# Patient Record
Sex: Female | Born: 1975 | Race: White | Hispanic: No | Marital: Single | Smoking: Heavy tobacco smoker
Health system: Southern US, Community
[De-identification: ages and names within clinical notes are randomized; demographics above are authoritative.]

## PROBLEM LIST (undated history)

## (undated) DIAGNOSIS — S069XAA Unspecified intracranial injury with loss of consciousness status unknown, initial encounter: Secondary | ICD-10-CM

## (undated) DIAGNOSIS — E119 Type 2 diabetes mellitus without complications: Secondary | ICD-10-CM

## (undated) DIAGNOSIS — S069X9A Unspecified intracranial injury with loss of consciousness of unspecified duration, initial encounter: Secondary | ICD-10-CM

## (undated) DIAGNOSIS — K760 Fatty (change of) liver, not elsewhere classified: Secondary | ICD-10-CM

## (undated) DIAGNOSIS — E282 Polycystic ovarian syndrome: Secondary | ICD-10-CM

## (undated) HISTORY — PX: BREAST SURGERY: SHX581

## (undated) HISTORY — PX: CYST REMOVAL LEG: SHX6280

---

## 2015-09-09 ENCOUNTER — Emergency Department
Admission: EM | Admit: 2015-09-09 | Discharge: 2015-09-09 | Disposition: A | Discharge status: Home / Self Care | Payer: No Typology Code available for payment source | Attending: Emergency Medicine | Admitting: Emergency Medicine

## 2015-09-09 ENCOUNTER — Emergency Department: Payer: No Typology Code available for payment source

## 2015-09-09 DIAGNOSIS — Y9389 Activity, other specified: Secondary | ICD-10-CM | POA: Insufficient documentation

## 2015-09-09 DIAGNOSIS — S0990XA Unspecified injury of head, initial encounter: Secondary | ICD-10-CM | POA: Insufficient documentation

## 2015-09-09 DIAGNOSIS — Z3202 Encounter for pregnancy test, result negative: Secondary | ICD-10-CM | POA: Diagnosis not present

## 2015-09-09 DIAGNOSIS — E282 Polycystic ovarian syndrome: Secondary | ICD-10-CM | POA: Insufficient documentation

## 2015-09-09 DIAGNOSIS — Y92410 Unspecified street and highway as the place of occurrence of the external cause: Secondary | ICD-10-CM | POA: Insufficient documentation

## 2015-09-09 DIAGNOSIS — Y998 Other external cause status: Secondary | ICD-10-CM | POA: Diagnosis not present

## 2015-09-09 DIAGNOSIS — S199XXA Unspecified injury of neck, initial encounter: Secondary | ICD-10-CM | POA: Diagnosis present

## 2015-09-09 DIAGNOSIS — S161XXA Strain of muscle, fascia and tendon at neck level, initial encounter: Secondary | ICD-10-CM | POA: Diagnosis not present

## 2015-09-09 LAB — POCT PREGNANCY, URINE: Preg Test, Ur: NEGATIVE

## 2015-09-09 MED ORDER — IBUPROFEN 800 MG PO TABS
800.0000 mg | ORAL_TABLET | Freq: Three times a day (TID) | ORAL | Status: DC | PRN
Start: 2015-09-09 — End: 2016-06-27

## 2015-09-09 MED ORDER — METHOCARBAMOL 500 MG PO TABS
1000.0000 mg | ORAL_TABLET | Freq: Once | ORAL | Status: DC
  Filled 2015-09-09: qty 2

## 2015-09-09 MED ORDER — IBUPROFEN 800 MG PO TABS
800.0000 mg | ORAL_TABLET | Freq: Once | ORAL | Status: AC
  Administered 2015-09-09: 800 mg via ORAL
  Filled 2015-09-09: qty 1

## 2015-09-09 MED ORDER — METHOCARBAMOL 750 MG PO TABS
1500.0000 mg | ORAL_TABLET | Freq: Four times a day (QID) | ORAL | Status: DC
Start: 2015-09-09 — End: 2016-06-27

## 2015-09-09 MED ORDER — TRAMADOL HCL 50 MG PO TABS: 50.0000 mg | ORAL_TABLET | Freq: Four times a day (QID) | ORAL | Status: DC | PRN

## 2015-09-09 MED ORDER — TRAMADOL HCL 50 MG PO TABS
50.0000 mg | ORAL_TABLET | Freq: Once | ORAL | Status: DC
  Filled 2015-09-09: qty 1
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
## 2015-09-09 NOTE — ED Notes (Signed)
Applied c collar

## 2015-09-09 NOTE — Discharge Instructions (Signed)
Cervical Sprain A cervical sprain is when the tissues (ligaments) that hold the neck bones in place stretch or tear. HOME CARE   Put ice on the injured area.  Put ice in a plastic bag.  Place a towel between your skin and the bag.  Leave the ice on for 15-20 minutes, 3-4 times a day.  You may have been given a collar to wear. This collar keeps your neck from moving while you heal.  Do not take the collar off unless told by your doctor.  If you have long hair, keep it outside of the collar.  Ask your doctor before changing the position of your collar. You may need to change its position over time to make it more comfortable.  If you are allowed to take off the collar for cleaning or bathing, follow your doctor's instructions on how to do it safely.  Keep your collar clean by wiping it with mild soap and water. Dry it completely. If the collar has removable pads, remove them every 1-2 days to hand wash them with soap and water. Allow them to air dry. They should be dry before you wear them in the collar.  Do not drive while wearing the collar.  Only take medicine as told by your doctor.  Keep all doctor visits as told.  Keep all physical therapy visits as told.  Adjust your work station so that you have good posture while you work.  Avoid positions and activities that make your problems worse.  Warm up and stretch before being active. GET HELP IF:  Your pain is not controlled with medicine.  You cannot take less pain medicine over time as planned.  Your activity level does not improve as expected. GET HELP RIGHT AWAY IF:   You are bleeding.  Your stomach is upset.  You have an allergic reaction to your medicine.  You develop new problems that you cannot explain.  You lose feeling (become numb) or you cannot move any part of your body (paralysis).  You have tingling or weakness in any part of your body.  Your symptoms get worse. Symptoms include:  Pain,  soreness, stiffness, puffiness (swelling), or a burning feeling in your neck.  Pain when your neck is touched.  Shoulder or upper back pain.  Limited ability to move your neck.  Headache.  Dizziness.  Your hands or arms feel week, lose feeling, or tingle.  Muscle spasms.  Difficulty swallowing or chewing. MAKE SURE YOU:   Understand these instructions.  Will watch your condition.  Will get help right away if you are not doing well or get worse. Document Released: 06/03/2008 Document Revised: 08/18/2013 Document Reviewed: 06/23/2013 ExitCare Patient Information 2015 [employer]. This information is not intended to replace advice given to you by your health care provider. Make sure you discuss any questions you have with your health care provider.  
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
## 2015-11-07 ENCOUNTER — Ambulatory Visit: Payer: BLUE CROSS/BLUE SHIELD | Admitting: Physical Therapy

## 2015-11-09 ENCOUNTER — Encounter: Payer: BLUE CROSS/BLUE SHIELD | Admitting: Physical Therapy

## 2015-11-14 ENCOUNTER — Ambulatory Visit: Payer: BLUE CROSS/BLUE SHIELD | Attending: General Practice | Admitting: Physical Therapy

## 2015-11-14 ENCOUNTER — Encounter: Payer: Self-pay | Admitting: Physical Therapy

## 2015-11-14 ENCOUNTER — Encounter: Payer: BLUE CROSS/BLUE SHIELD | Admitting: Physical Therapy

## 2015-11-14 DIAGNOSIS — R29898 Other symptoms and signs involving the musculoskeletal system: Secondary | ICD-10-CM | POA: Insufficient documentation

## 2015-11-14 DIAGNOSIS — R42 Dizziness and giddiness: Secondary | ICD-10-CM | POA: Diagnosis present

## 2015-11-14 DIAGNOSIS — M542 Cervicalgia: Secondary | ICD-10-CM
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
## 2015-11-15 ENCOUNTER — Ambulatory Visit: Payer: BLUE CROSS/BLUE SHIELD

## 2015-11-15 DIAGNOSIS — R42 Dizziness and giddiness: Secondary | ICD-10-CM
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
## 2015-11-16 ENCOUNTER — Encounter: Payer: BLUE CROSS/BLUE SHIELD | Admitting: Physical Therapy

## 2015-11-20 ENCOUNTER — Encounter: Payer: Self-pay | Admitting: Physical Therapy

## 2015-11-20 ENCOUNTER — Ambulatory Visit: Payer: BLUE CROSS/BLUE SHIELD | Admitting: Physical Therapy

## 2015-11-20 DIAGNOSIS — M542 Cervicalgia: Secondary | ICD-10-CM

## 2015-11-20 DIAGNOSIS — R42 Dizziness and giddiness: Secondary | ICD-10-CM

## 2015-11-20 DIAGNOSIS — R29898 Other symptoms and signs involving the musculoskeletal system: Secondary | ICD-10-CM

## 2015-11-20 NOTE — Therapy (Signed)
[facility] [address] , [place], [address] Phone: [phone]   Fax:  [phone]  Physical Therapy Treatment  Patient Details  Name: Paula Cherry MRN: [id number] Date of Birth: 07/13/1976 Referring Provider: Mariam Dollarkearns amy  Encounter Date: 11/20/2015      PT End of Session - 11/20/15 1625    Visit Number 2   Number of Visits 25   Date for PT Re-Evaluation 02/06/16   PT Start Time 0410   PT Stop Time 0445   PT Time Calculation (min) 35 min   Activity Tolerance Patient limited by pain   Behavior During Therapy Anxious      History reviewed. No pertinent past medical history.  History reviewed. No pertinent past surgical history.  There were no vitals filed for this visit.  Visit Diagnosis:  Dizziness and giddiness  Neck pain  Decreased ROM of neck      Subjective Assessment - 11/20/15 1614    Subjective Patient is having 8/10 pain to her neck and her head, which is  constant. She says that her neck is has been huritng worse since the day of the evaluation which was 7/10.  She also has had her hair colored and was able to tolerate the required position to have it colored.    Currently in Pain? Yes   Pain Score 8    Pain Location Head   Pain Onset More than a month ago        Patient has reports of 8/10 at onset of appointment. She reports that her neck has been a lot worse since her evaluation from PT. She also reports that she used a virtual reality glasses that made her dizzy and has not tried to decrease her pain with heat or ice. She says that the heat makes her too hot. She did get her hair colored which would require head movements to get into position to rinse her hair.  She was seen today for reduction of pain symptoms using IFC e-stim at 7.4 intensity crossed pattern to neck x 20 mintues.  ROM neck rotation Left 50          Neck rotation right 70          Neck SBL40          Neck SBR 30    Neck flex 30          Neck extension 55 Patient response to treatment is 8 /10 pain to neck.                            PT Education - 11/20/15 1615    Education provided Yes   Education Details heat or ice   Person(s) Educated Patient   Methods Explanation   Comprehension Verbalized understanding             PT Long Term Goals - 11/14/15 1739    PT LONG TERM GOAL #1   Title Patient will increase BLE gross strength to 4+/5 as to improve functional strength for independent gait, increased standing tolerance and increased ADL ability.   Time 12   Period Weeks   Status New   PT LONG TERM GOAL #2   Title Patient will be able to perform household work/ chores without increase in symptoms   Time 12   Period Weeks   Status New   PT LONG TERM GOAL #3   Title Patient  will report a worst pain of 3/10 on VAS in             to improve tolerance with ADLs and reduced symptoms with activities.    Time 12   Period Weeks   Status New   PT LONG TERM GOAL #4   Title Patient will reduce NDI score to <20 as to demonstrate minimal disability with ADLs including improved sleeping tolerance,sitting tolerance etc for better mobility with ADLs   Time 12   Period Weeks   Status New               Plan - 11/20/15 1626    Clinical Impression Statement Patient continues to have high level of pain in neck and head. She used virtual glasses that require a lot of head movement at home and she reports made her dizzy. She has not tried the heat or ice that was recommended for pain reduction. She was treated with IFC crossed pattern to neck and gentle AROM movements.    Pt will benefit from skilled therapeutic intervention in order to improve on the following deficits Decreased mobility;Dizziness;Decreased range of motion;Decreased activity tolerance;Decreased strength;Impaired flexibility;Pain   Rehab Potential Good   PT Frequency 2x / week   PT Duration 12 weeks   PT  Treatment/Interventions Cryotherapy;Canalith Repostioning;Electrical Stimulation;Moist Heat;Traction;Ultrasound;Therapeutic activities;Therapeutic exercise;Manual techniques   PT Next Visit Plan Epley   PT Home Exercise Plan heating pad,   Consulted and Agree with Plan of Care Patient        Problem List There are no active problems to display for this patient.   Ezekiel Ina 11/20/2015, 4:28 PM  [place] [facility] MAIN [facility] SERVICES [address] [place], [place], [address] Phone: [phone]   Fax:  [phone]  Name: Jenan Buelow MRN: [id number] Date of Birth: 1976-07-01

## 2015-11-22 ENCOUNTER — Ambulatory Visit: Payer: BLUE CROSS/BLUE SHIELD

## 2015-11-22 DIAGNOSIS — M542 Cervicalgia: Secondary | ICD-10-CM

## 2015-11-22 DIAGNOSIS — R29898 Other symptoms and signs involving the musculoskeletal system: Secondary | ICD-10-CM

## 2015-11-22 NOTE — Therapy (Signed)
[place] [facility] MAIN [facility] [address] [place], [place], [address] Phone: [phone]   Fax:  [phone]  Physical Therapy Treatment  Patient Details  Name: Paula Cherry MRN: [id number] Date of Birth: 02/02/1976 Referring Provider: Mariam Dollarkearns amy  Encounter Date: 11/22/2015      PT End of Session - 11/22/15 1631    Visit Number 3   Number of Visits 25   Date for PT Re-Evaluation 02/06/16   PT Start Time 1610   PT Stop Time 1655   PT Time Calculation (min) 45 min   Activity Tolerance Patient limited by pain   Behavior During Therapy Anxious      No past medical history on file.  No past surgical history on file.  There were no vitals filed for this visit.  Visit Diagnosis:  Neck pain  Decreased ROM of neck      Subjective Assessment - 11/22/15 1626    Subjective Patient is having 7/10 pain to her R neck and her head, which is constant but worsens with head movement. Pt reports she is having a lot of L lateral neck pain (points near SCM area) with L rotation. Pt reports that when she turns her head to the L she feels like "my throat gets stuck on my neck."  Pt is performing HEP but states that head rotation increase her dizziness. [facility]. No specific questions on this date.    Currently in Pain? Yes   Pain Score 7    Pain Location Neck   Pain Orientation Right;Left  worse on R   Pain Type Chronic pain   Pain Onset More than a month ago      Treatment  E-stim  IFC e-stim starting at 6.8V and gradually increasing throughout time to 9.2V, applied MHP with therapy;   Manual Therapy Grade I central PA mobs to C2-T4 20 seconds/bout, 1 bout level for pain and spasm reduction. Pt reports pain with very gentle mobs of neck but with improving tolerance; Bilateral posterior cervical STM of paraspinals/extensors; Suboccipital release x 2 minutes; PROM of cervical spine into rotation  and lateral flexion with cues for patient to relax. Pt with considerable pain limiting L rotation; Review of HEP and emphasized importance                        PT Education - 11/22/15 1631    Education provided Yes   Education Details AROM HEP reinforced   Person(s) Educated Patient   Methods Explanation   Comprehension Verbalized understanding             PT Long Term Goals - 11/14/15 1739    PT LONG TERM GOAL #1   Title Patient will increase BLE gross strength to 4+/5 as to improve functional strength for independent gait, increased standing tolerance and increased ADL ability.   Time 12   Period Weeks   Status New   PT LONG TERM GOAL #2   Title Patient will be able to perform household work/ chores without increase in symptoms   Time 12   Period Weeks   Status New   PT LONG TERM GOAL #3   Title Patient will report a worst pain of 3/10 on VAS in             to improve tolerance with ADLs and reduced symptoms with activities.    Time 12  Period Weeks   Status New   PT LONG TERM GOAL #4   Title Patient will reduce NDI score to <20 as to demonstrate minimal disability with ADLs including improved sleeping tolerance,sitting tolerance etc for better mobility with ADLs   Time 12   Period Weeks   Status New               Plan - 11/22/15 1632    Clinical Impression Statement Pt continues to report high levels of neck and head pain which has not improved. She reports decreased sensation on posterior L neck during e-stim today. Pt reports decrease in neck pain to 6/10 following estim and manual therapy today. Discussed plan of care to continue reducing pain with goal of restoring neck AROM. Pt with decreased tolerance to AROM of cervical spine.    Pt will benefit from skilled therapeutic intervention in order to improve on the following deficits Decreased mobility;Dizziness;Decreased range of motion;Decreased activity tolerance;Decreased  strength;Impaired flexibility;Pain   Rehab Potential Good   PT Frequency 2x / week   PT Duration 12 weeks   PT Treatment/Interventions Cryotherapy;Canalith Repostioning;Electrical Stimulation;Moist Heat;Traction;Ultrasound;Therapeutic activities;Therapeutic exercise;Manual techniques   PT Next Visit Plan Continue modalities and manual therapy to decrease  pain in order to initiate more strengthening.    PT Home Exercise Plan heating pad, cervical AROM flexion and rotation   Consulted and Agree with Plan of Care Patient        Problem List There are no active problems to display for this patient.  Lynnea Maizes PT, DPT Huprich,Jason 11/22/2015, 5:28 PM  [facility] MAIN [facility] SERVICES [address] [place], [place], [address] Phone: [phone]   Fax:  [phone]  Name: Paula Cherry MRN: [id number] Date of Birth: [birth year]

## 2015-11-27 ENCOUNTER — Ambulatory Visit: Payer: BLUE CROSS/BLUE SHIELD | Admitting: Physical Therapy

## 2015-11-27 ENCOUNTER — Encounter: Payer: Self-pay | Admitting: Physical Therapy

## 2015-11-27 DIAGNOSIS — M542 Cervicalgia: Secondary | ICD-10-CM

## 2015-11-27 DIAGNOSIS — R29898 Other symptoms and signs involving the musculoskeletal system: Secondary | ICD-10-CM

## 2015-11-27 DIAGNOSIS — R42 Dizziness and giddiness: Secondary | ICD-10-CM

## 2015-11-27 NOTE — Therapy (Signed)
[place] [facility] MAIN [facility] SERVICES [address] [place], [place], [address] Phone: [phone]   Fax:  [phone]  Physical Therapy Treatment  Patient Details  Name: Paula Cherry MRN: [id number] Date of Birth: August 15, 1976 Referring Provider: Mariam Dollar amy  Encounter Date: 11/27/2015      PT End of Session - 11/27/15 1623    Visit Number 4   Number of Visits 25   Date for PT Re-Evaluation 02/06/16   PT Start Time 0410   PT Stop Time 0445   PT Time Calculation (min) 35 min      History reviewed. No pertinent past medical history.  History reviewed. No pertinent past surgical history.  There were no vitals filed for this visit.  Visit Diagnosis:  Neck pain  Decreased ROM of neck  Dizziness and giddiness      Subjective Assessment - 11/27/15 1616    Subjective Patient is having 7/10 pain that is worse on the sides of her neck. 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    Pain Score 7    Pain Location Neck   Pain Onset More than a month ago         Patient continues to have 7/10 pain reports using a little bit of heat at home and that hot shower feels the best. She has difficulty with neck extension and feels like her neck is locking up. She says that the inside of her throat is catching on the back of her neck. She says that the x-rays show that her neck is pushed the wrong way. She is not able to perform her AROM exercises due to getting dizzy; but then later says that she is doing them.  She was seen for e-stim  IFC crossed pattern to neck and MH to neck, followed by [place] to neck musculature bilaterally 1.5 cm squared x 15 minutes.  She does not tolerate AROM due to dizziness. Instructed in scapula retraction and chin tuck.  Reviewed HEP of AROM.  Patient reports that she has 7/10 pain following treatment.                         PT Education - 11/27/15 1623    Education provided Yes   Education Details HEP   Person(s)  Educated Patient   Methods Explanation   Comprehension Verbalized understanding             PT Long Term Goals - 11/14/15 1739    PT LONG TERM GOAL #1   Title Patient will increase BLE gross strength to 4+/5 as to improve functional strength for independent gait, increased standing tolerance and increased ADL ability.   Time 12   Period Weeks   Status New   PT LONG TERM GOAL #2   Title Patient will be able to perform household work/ chores without increase in symptoms   Time 12   Period Weeks   Status New   PT LONG TERM GOAL #3   Title Patient will report a worst pain of 3/10 on VAS in             to improve tolerance with ADLs and reduced symptoms with activities.    Time 12   Period Weeks   Status New   PT LONG TERM GOAL #4   Title Patient will reduce NDI score to <20 as to demonstrate minimal disability with ADLs including improved sleeping tolerance,sitting tolerance etc for better mobility with ADLs  Time 12   Period Weeks   Status New               Problem List There are no active problems to display for this patient.   Ezekiel InaMansfield, Jenniferann Stuckert S 11/27/2015, 5:02 PM  [facility] [address] [place], [place], [address] Phone: [phone]   Fax:  [phone]  Name: Paula Cherry MRN: [id number] Date of Birth: 08/14/1976

## 2015-11-29 ENCOUNTER — Ambulatory Visit: Payer: BLUE CROSS/BLUE SHIELD | Admitting: Physical Therapy

## 2015-11-30 ENCOUNTER — Ambulatory Visit: Payer: BLUE CROSS/BLUE SHIELD | Attending: General Practice | Admitting: Physical Therapy

## 2015-11-30 DIAGNOSIS — R29898 Other symptoms and signs involving the musculoskeletal system: Secondary | ICD-10-CM | POA: Insufficient documentation

## 2015-11-30 DIAGNOSIS — M542 Cervicalgia: Secondary | ICD-10-CM | POA: Insufficient documentation

## 2015-11-30 DIAGNOSIS — R42 Dizziness and giddiness: Secondary | ICD-10-CM | POA: Insufficient documentation

## 2015-12-04 ENCOUNTER — Ambulatory Visit: Payer: BLUE CROSS/BLUE SHIELD | Admitting: Physical Therapy

## 2015-12-04 DIAGNOSIS — M542 Cervicalgia: Secondary | ICD-10-CM | POA: Diagnosis present

## 2015-12-04 DIAGNOSIS — R29898 Other symptoms and signs involving the musculoskeletal system: Secondary | ICD-10-CM | POA: Diagnosis present

## 2015-12-04 DIAGNOSIS — R42 Dizziness and giddiness: Secondary | ICD-10-CM | POA: Diagnosis present
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
## 2015-12-06 ENCOUNTER — Ambulatory Visit: Payer: BLUE CROSS/BLUE SHIELD | Admitting: Physical Therapy

## 2015-12-06 ENCOUNTER — Encounter: Payer: Self-pay | Admitting: Physical Therapy

## 2015-12-06 DIAGNOSIS — M542 Cervicalgia: Secondary | ICD-10-CM

## 2015-12-06 DIAGNOSIS — R29898 Other symptoms and signs involving the musculoskeletal system: Secondary | ICD-10-CM

## 2015-12-06 DIAGNOSIS — R42 Dizziness and giddiness: Secondary | ICD-10-CM

## 2015-12-06 NOTE — Therapy (Signed)
[place] [facility] MAIN [facility] SERVICES [address] [place], [place], [address] Phone: [phone]   Fax:  [phone]  Physical Therapy Treatment  Patient Details  Name: Paula Cherry MRN: [id number] Date of Birth: 08/05/1976 Referring Provider: Mariam Dollar amy  Encounter Date: 12/06/2015      PT End of Session - 12/06/15 1617    Visit Number 6   Number of Visits 25   Date for PT Re-Evaluation 02/06/16   PT Start Time 0400   PT Stop Time 0445   PT Time Calculation (min) 45 min   Activity Tolerance Patient tolerated treatment well;Patient limited by pain   Behavior During Therapy Anxious;Agitated      History reviewed. No pertinent past medical history.  History reviewed. No pertinent past surgical history.  There were no vitals filed for this visit.  Visit Diagnosis:  Neck pain  Decreased ROM of neck  Dizziness and giddiness      Subjective Assessment - 12/06/15 1612    Subjective Patient reports that she continues to have dizziness, severe pain in neck 9/10. She worked today and feel that her pain is increased from  working.  She reports that she is having increased BP and HR and she has family medical history of heart disease. She is seening and  ENT Jan 9th, 2017 for the ringing in her earls L>R    Limitations Sitting   Currently in Pain? Yes   Pain Score 9    Pain Onset More than a month ago                                 PT Education - 12/06/15 1616    Education Details Reviewed ROM exercises and using heat for pain.    Person(s) Educated Patient   Methods Explanation   Comprehension Verbalized understanding             PT Long Term Goals - 11/14/15 1739    PT LONG TERM GOAL #1   Title Patient will increase BLE gross strength to 4+/5 as to improve functional strength for independent gait, increased standing tolerance and increased ADL ability.   Time 12   Period Weeks   Status New   PT LONG TERM  GOAL #2   Title Patient will be able to perform household work/ chores without increase in symptoms   Time 12   Period Weeks   Status New   PT LONG TERM GOAL #3   Title Patient will report a worst pain of 3/10 on VAS in             to improve tolerance with ADLs and reduced symptoms with activities.    Time 12   Period Weeks   Status New   PT LONG TERM GOAL #4   Title Patient will reduce NDI score to <20 as to demonstrate minimal disability with ADLs including improved sleeping tolerance,sitting tolerance etc for better mobility with ADLs   Time 12   Period Weeks   Status New               Plan - 12/06/15 1617    Clinical Impression Statement Patient continues to have increased pain and difficulty with neck movements due to pain and dizziness. ROM  right rotation 70 deg, left rotation 50 deg  left SB l40 SB right 40 flex  40 , ext 55 deg. All movements are painful.  Pt will benefit from skilled therapeutic intervention in order to improve on the following deficits Decreased mobility;Dizziness;Decreased range of motion;Decreased activity tolerance;Decreased strength;Impaired flexibility;Pain   Rehab Potential Good   PT Frequency 2x / week   PT Treatment/Interventions Cryotherapy;Canalith Repostioning;Electrical Stimulation;Moist Heat;Traction;Ultrasound;Therapeutic activities;Therapeutic exercise;Manual techniques   PT Next Visit Plan Progress postural strengthening and soft tissue mobilization as indicated and appropriate.    PT Home Exercise Plan Low rows and mid rows with yellow t-band    Consulted and Agree with Plan of Care Patient        Problem List There are no active problems to display for this patient.   Ezekiel InaMansfield, Layan Zalenski S 12/06/2015, 4:49 PM  [facility] MAIN [facility] [address] [place], [place], [address] Phone: [phone]   Fax:  [phone]  Name: Ashlley Detert MRN: [id number] Date of Birth:  02/25/1976

## 2015-12-06 NOTE — Therapy (Addendum)
[place] [facility] MAIN [facility] SERVICES [address] [place], [place], [address] Phone: [phone]   Fax:  [phone]  Physical Therapy Treatment  Patient Details  Name: Paula Cherry MRN: [id number] Date of Birth: [birth year] Referring Provider: Mariam Dollar amy  Encounter Date: 12/06/2015      PT End of Session - 12/06/15 1617    Visit Number 6   Number of Visits 25   Date for PT Re-Evaluation 02/06/16   PT Start Time 0400   PT Stop Time 0445   PT Time Calculation (min) 45 min   Activity Tolerance Patient tolerated treatment well;Patient limited by pain   Behavior During Therapy Anxious;Agitated      History reviewed. No pertinent past medical history.  History reviewed. No pertinent past surgical history.  There were no vitals filed for this visit.  Visit Diagnosis:  Neck pain  Decreased ROM of neck  Dizziness and giddiness      Subjective Assessment - 12/06/15 1612    Subjective Patient reports that she continues to have dizziness, severe pain in neck 9/10. She worked today and feel that her pain is increased from  working.  She reports that she is having increased BP and HR and she has family medical history of heart disease. She is seeing and  ENT Jan 9th, 2017 for the ringing in her earls L>R    Limitations Sitting   Currently in Pain? Yes   Pain Score 9    Pain Onset More than a month ago      E-stim with crossed pattern to neck and upper thoracic spine x 20 minutes with MH to neck.  ROM measurements followed by review of HEP. Patient continues to have high level of pain 9/10 wiith reports of dizziness and head aches.                            PT Education - 12/06/15 1616    Education Details Reviewed ROM exercises and using heat for pain.    Person(s) Educated Patient   Methods Explanation   Comprehension Verbalized understanding             PT Long Term Goals - 11/14/15 1739    PT LONG TERM GOAL  #1   Title Patient will increase BLE gross strength to 4+/5 as to improve functional strength for independent gait, increased standing tolerance and increased ADL ability.   Time 12   Period Weeks   Status New   PT LONG TERM GOAL #2   Title Patient will be able to perform household work/ chores without increase in symptoms   Time 12   Period Weeks   Status New   PT LONG TERM GOAL #3   Title Patient will report a worst pain of 3/10 on VAS in             to improve tolerance with ADLs and reduced symptoms with activities.    Time 12   Period Weeks   Status New   PT LONG TERM GOAL #4   Title Patient will reduce NDI score to <20 as to demonstrate minimal disability with ADLs including improved sleeping tolerance,sitting tolerance etc for better mobility with ADLs   Time 12   Period Weeks   Status New               Plan - 12/06/15 1617    Clinical Impression Statement Patient continues to have  increased pain and difficulty with neck movements due to pain and dizziness. ROM  right rotation 70 deg, left rotation 50 deg  left SB l40 SB right 40 flex  40 , ext 55 deg. All movements are painful.   Pt will benefit from skilled therapeutic intervention in order to improve on the following deficits Decreased mobility;Dizziness;Decreased range of motion;Decreased activity tolerance;Decreased strength;Impaired flexibility;Pain   Rehab Potential Good   PT Frequency 2x / week   PT Treatment/Interventions Cryotherapy;Canalith Repostioning;Electrical Stimulation;Moist Heat;Traction;Ultrasound;Therapeutic activities;Therapeutic exercise;Manual techniques   PT Next Visit Plan Progress postural strengthening and soft tissue mobilization as indicated and appropriate.    PT Home Exercise Plan Low rows and mid rows with yellow t-band    Consulted and Agree with Plan of Care Patient        Problem List There are no active problems to display for this patient.   Ezekiel InaMansfield, Nihal Doan S 12/06/2015,  4:54 PM  [facility] [address] [place], [place], [address] Phone: [phone]   Fax:  [phone]  Name: Paula Cherry MRN: [id number] Date of Birth: 09/01/1976

## 2015-12-11 ENCOUNTER — Ambulatory Visit: Payer: BLUE CROSS/BLUE SHIELD | Admitting: Physical Therapy

## 2015-12-13 ENCOUNTER — Ambulatory Visit: Payer: BLUE CROSS/BLUE SHIELD

## 2015-12-18 ENCOUNTER — Encounter: Payer: BLUE CROSS/BLUE SHIELD | Admitting: Physical Therapy

## 2015-12-20 ENCOUNTER — Encounter: Payer: BLUE CROSS/BLUE SHIELD | Admitting: Physical Therapy

## 2015-12-27 ENCOUNTER — Encounter: Payer: BLUE CROSS/BLUE SHIELD | Admitting: Physical Therapy

## 2016-01-03 ENCOUNTER — Encounter: Payer: BLUE CROSS/BLUE SHIELD | Admitting: Physical Therapy

## 2016-03-15 ENCOUNTER — Emergency Department (HOSPITAL_COMMUNITY)
Admission: EM | Admit: 2016-03-15 | Discharge: 2016-03-15 | Disposition: A | Discharge status: Home / Self Care | Payer: BLUE CROSS/BLUE SHIELD | Attending: Emergency Medicine | Admitting: Emergency Medicine

## 2016-03-15 ENCOUNTER — Encounter (HOSPITAL_COMMUNITY): Payer: Self-pay | Admitting: Nurse Practitioner

## 2016-03-15 DIAGNOSIS — F17200 Nicotine dependence, unspecified, uncomplicated: Secondary | ICD-10-CM | POA: Insufficient documentation

## 2016-03-15 DIAGNOSIS — N939 Abnormal uterine and vaginal bleeding, unspecified: Secondary | ICD-10-CM | POA: Diagnosis present

## 2016-03-15 HISTORY — DX: Unspecified intracranial injury with loss of consciousness status unknown, initial encounter: S06.9XAA

## 2016-03-15 HISTORY — DX: Unspecified intracranial injury with loss of consciousness of unspecified duration, initial encounter: S06.9X9A

## 2016-03-15 HISTORY — DX: Polycystic ovarian syndrome: E28.2

## 2016-03-15 NOTE — ED Notes (Signed)
Pt c/o vaginal spotting and lower back pain since Monday onset after 12 hours of heavy lifting. She took 4 positive pregnancy tests at home in January but did not confirm pregnancy with a doctor. Her LMP was approximately 12/17/15.

## 2016-03-15 NOTE — ED Notes (Signed)
Called in waiting area x2, no answer 
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
## 2016-03-15 NOTE — ED Notes (Signed)
Called in waiting area x1, no answer

## 2016-03-15 NOTE — ED Notes (Signed)
Called in waiting room x3, no answer

## 2016-04-18 ENCOUNTER — Encounter: Payer: Self-pay | Admitting: Emergency Medicine

## 2016-04-18 ENCOUNTER — Emergency Department
Admission: EM | Admit: 2016-04-18 | Discharge: 2016-04-18 | Disposition: A | Discharge status: Home / Self Care | Payer: BLUE CROSS/BLUE SHIELD | Attending: Emergency Medicine | Admitting: Emergency Medicine

## 2016-04-18 ENCOUNTER — Emergency Department: Payer: BLUE CROSS/BLUE SHIELD

## 2016-04-18 DIAGNOSIS — Z79899 Other long term (current) drug therapy: Secondary | ICD-10-CM | POA: Diagnosis not present

## 2016-04-18 DIAGNOSIS — R1084 Generalized abdominal pain: Secondary | ICD-10-CM | POA: Insufficient documentation

## 2016-04-18 DIAGNOSIS — Z87891 Personal history of nicotine dependence: Secondary | ICD-10-CM | POA: Insufficient documentation

## 2016-04-18 HISTORY — DX: Fatty (change of) liver, not elsewhere classified: K76.0

## 2016-04-18 LAB — URINALYSIS COMPLETE WITH MICROSCOPIC (ARMC ONLY)
Bilirubin Urine: NEGATIVE
GLUCOSE, UA: NEGATIVE mg/dL
Hgb urine dipstick: NEGATIVE
Ketones, ur: NEGATIVE mg/dL
Leukocytes, UA: NEGATIVE
NITRITE: NEGATIVE
Protein, ur: NEGATIVE mg/dL
SPECIFIC GRAVITY, URINE: 1.024 (ref 1.005–1.030)
pH: 5 (ref 5.0–8.0)

## 2016-04-18 LAB — CBC
HEMATOCRIT: 46.8 % (ref 35.0–47.0)
Hemoglobin: 16.1 g/dL — ABNORMAL HIGH (ref 12.0–16.0)
MCH: 31.9 pg (ref 26.0–34.0)
MCHC: 34.4 g/dL (ref 32.0–36.0)
MCV: 92.7 fL (ref 80.0–100.0)
Platelets: 174 10*3/uL (ref 150–440)
RBC: 5.05 MIL/uL (ref 3.80–5.20)
RDW: 13.9 % (ref 11.5–14.5)
WBC: 9.9 10*3/uL (ref 3.6–11.0)

## 2016-04-18 LAB — COMPREHENSIVE METABOLIC PANEL
ALBUMIN: 4.5 g/dL (ref 3.5–5.0)
ALK PHOS: 79 U/L (ref 38–126)
ALT: 61 U/L — AB (ref 14–54)
AST: 42 U/L — AB (ref 15–41)
Anion gap: 9 (ref 5–15)
BILIRUBIN TOTAL: 1 mg/dL (ref 0.3–1.2)
BUN: 10 mg/dL (ref 6–20)
CALCIUM: 9.4 mg/dL (ref 8.9–10.3)
CO2: 24 mmol/L (ref 22–32)
Chloride: 108 mmol/L (ref 101–111)
Creatinine, Ser: 0.76 mg/dL (ref 0.44–1.00)
GFR calc Af Amer: >60 mL/min (ref >60)
GFR calc non Af Amer: >60 mL/min (ref >60)
GLUCOSE: 107 mg/dL — AB (ref 65–99)
Potassium: 4 mmol/L (ref 3.5–5.1)
Sodium: 141 mmol/L (ref 135–145)
TOTAL PROTEIN: 7.4 g/dL (ref 6.5–8.1)

## 2016-04-18 LAB — LIPASE, BLOOD: Lipase: 45 U/L (ref 11–51)

## 2016-04-18 LAB — TROPONIN I: TROPONIN I: 0.05 ng/mL — AB (ref <0.031)

## 2016-04-18 LAB — PREGNANCY, URINE: Preg Test, Ur: NEGATIVE

## 2016-04-18 MED ORDER — ONDANSETRON 8 MG PO TBDP: 8.0000 mg | ORAL_TABLET | Freq: Three times a day (TID) | ORAL | Status: DC | PRN

## 2016-04-18 MED ORDER — HYDROMORPHONE HCL 1 MG/ML IJ SOLN
0.5000 mg | Freq: Once | INTRAMUSCULAR | Status: AC
  Administered 2016-04-18: 0.5 mg via INTRAVENOUS
  Filled 2016-04-18: qty 1

## 2016-04-18 MED ORDER — IOPAMIDOL (ISOVUE-300) INJECTION 61%
100.0000 mL | Freq: Once | INTRAVENOUS | Status: AC | PRN
  Administered 2016-04-18: 100 mL via INTRAVENOUS

## 2016-04-18 MED ORDER — DIATRIZOATE MEGLUMINE & SODIUM 66-10 % PO SOLN
15.0000 mL | Freq: Once | ORAL | Status: AC
  Administered 2016-04-18: 15 mL via ORAL
  Filled 2016-04-18: qty 30

## 2016-04-18 MED ORDER — HYDROMORPHONE HCL 1 MG/ML IJ SOLN
1.0000 mg | Freq: Once | INTRAMUSCULAR | Status: AC
  Administered 2016-04-18: 1 mg via INTRAVENOUS
  Filled 2016-04-18: qty 1

## 2016-04-18 MED ORDER — HYDROMORPHONE HCL 1 MG/ML IJ SOLN
INTRAMUSCULAR | Status: AC
  Administered 2016-04-18: 1 mg via INTRAVENOUS
  Filled 2016-04-18: qty 1

## 2016-04-18 MED ORDER — HYDROMORPHONE HCL 1 MG/ML IJ SOLN
1.0000 mg | Freq: Once | INTRAMUSCULAR | Status: AC
  Administered 2016-04-18: 1 mg via INTRAVENOUS

## 2016-04-18 MED ORDER — ONDANSETRON HCL 4 MG/2ML IJ SOLN
4.0000 mg | Freq: Once | INTRAMUSCULAR | Status: AC
  Administered 2016-04-18: 4 mg via INTRAVENOUS

## 2016-04-18 MED ORDER — ONDANSETRON HCL 4 MG/2ML IJ SOLN
INTRAMUSCULAR | Status: AC
  Administered 2016-04-18: 4 mg via INTRAVENOUS
  Filled 2016-04-18: qty 2

## 2016-04-18 MED ORDER — SODIUM CHLORIDE 0.9 % IV BOLUS (SEPSIS)
1000.0000 mL | Freq: Once | INTRAVENOUS | Status: AC
  Administered 2016-04-18: 1000 mL via INTRAVENOUS

## 2016-04-18 MED ORDER — OXYCODONE-ACETAMINOPHEN 5-325 MG PO TABS: 1.0000 | ORAL_TABLET | Freq: Four times a day (QID) | ORAL | Status: DC | PRN

## 2016-04-18 NOTE — ED Notes (Signed)
Reviewed d/c instructions, follow-up care, prescriptions, need to not drive with prescription medications and dilaudid given in ED. Pt verbalized understanding.

## 2016-04-18 NOTE — ED Provider Notes (Addendum)
[facility] Emergency Department Provider Note  ____________________________________________  Time seen: 1:30 PM  I have reviewed the triage vital signs and the nursing notes.   HISTORY  Chief Complaint Abdominal Pain    HPI Paula Cherry is a 40 y.o. female who complains of bilateral upper quadrant abdominal pain with vomiting that started this morning. It wraps around to her left flank. Feels like previous pancreatitis. No dysuria frequency urgency. No fever no diarrhea. Pain is constant aching and severe. No aggravating or alleviating factors.     Past Medical History  Diagnosis Date  . Traumatic brain injury (HCC)   . Polycystic ovarian disease   . Fatty liver      There are no active problems to display for this patient.    Past Surgical History  Procedure Laterality Date  . Breast surgery    . Cyst removal leg       Current Outpatient Rx  Name  Route  Sig  Dispense  Refill  . ibuprofen (ADVIL,MOTRIN) 800 MG tablet   Oral   Take 1 tablet (800 mg total) by mouth every 8 (eight) hours as needed for moderate pain.   15 tablet   0   . methocarbamol (ROBAXIN-750) 750 MG tablet   Oral   Take 2 tablets (1,500 mg total) by mouth 4 (four) times daily.   40 tablet   0   . ondansetron (ZOFRAN ODT) 8 MG disintegrating tablet   Oral   Take 1 tablet (8 mg total) by mouth every 8 (eight) hours as needed for nausea or vomiting.   20 tablet   0   . oxyCODONE-acetaminophen (ROXICET) 5-325 MG tablet   Oral   Take 1 tablet by mouth every 6 (six) hours as needed for severe pain.   12 tablet   0   . traMADol (ULTRAM) 50 MG tablet   Oral   Take 1 tablet (50 mg total) by mouth every 6 (six) hours as needed for moderate pain.   12 tablet   0      Allergies Zithromax   No family history on file.  Social History Social History  Substance Use Topics  . Smoking status: Light Tobacco Smoker  . Smokeless tobacco: None  . Alcohol Use: No     Review of Systems  Constitutional:   No fever or chills.  Eyes:   No vision changes.  ENT:   No sore throat. No rhinorrhea. Cardiovascular:   No chest pain. Respiratory:   No dyspnea or cough. Gastrointestinal:   Positive abdominal pain as above with vomiting..  No bloody stool. Genitourinary:   Negative for dysuria or difficulty urinating. Musculoskeletal:   Negative for focal pain or swelling Neurological:   Negative for headaches 10-point ROS otherwise negative.  ____________________________________________   PHYSICAL EXAM:  VITAL SIGNS: ED Triage Vitals  Enc Vitals Group     BP 04/18/16 1252 120/97 mmHg     Pulse Rate 04/18/16 1252 126     Resp 04/18/16 1252 18     Temp 04/18/16 1252 98.2 F (36.8 C)     Temp Source 04/18/16 1252 Oral     SpO2 04/18/16 1252 97 %     Weight 04/18/16 1252 170 lb (77.111 kg)     Height 04/18/16 1252 5' 5.5" (1.664 m)     Head Cir --      Peak Flow --      Pain Score 04/18/16 1253 8     Pain  Loc --      Pain Edu? --      Excl. in GC? --     Vital signs reviewed, nursing assessments reviewed.   Constitutional:   Alert and oriented. Mild distress due to pain. Eyes:   No scleral icterus. No conjunctival pallor. PERRL. EOMI ENT   Head:   Normocephalic and atraumatic.   Nose:   No congestion/rhinnorhea. No septal hematoma   Mouth/Throat:   MMM, no pharyngeal erythema. No peritonsillar mass.    Neck:   No stridor. No SubQ emphysema. No meningismus. Hematological/Lymphatic/Immunilogical:   No cervical lymphadenopathy. Cardiovascular:   RRR. Symmetric bilateral radial and DP pulses.  No murmurs.  Respiratory:   Normal respiratory effort without tachypnea nor retractions. Breath sounds are clear and equal bilaterally. No wheezes/rales/rhonchi. Gastrointestinal:   Soft with generalized abdominal tenderness. Non distended. There is no CVA tenderness.  No rebound, rigidity, or guarding. Genitourinary:    deferred Musculoskeletal:   Nontender with normal range of motion in all extremities. No joint effusions.  No lower extremity tenderness.  No edema. Neurologic:   Normal speech and language.  CN 2-10 normal. Motor grossly intact. No gross focal neurologic deficits are appreciated.  Skin:    Skin is warm, dry and intact. No rash noted.  No petechiae, purpura, or bullae.  ____________________________________________    LABS (pertinent positives/negatives) (all labs ordered are listed, but only abnormal results are displayed) Labs Reviewed  COMPREHENSIVE METABOLIC PANEL - Abnormal; Notable for the following:    Glucose, Bld 107 (*)    AST 42 (*)    ALT 61 (*)    All other components within normal limits  CBC - Abnormal; Notable for the following:    Hemoglobin 16.1 (*)    All other components within normal limits  URINALYSIS COMPLETEWITH MICROSCOPIC ([facility] ONLY) - Abnormal; Notable for the following:    Color, Urine YELLOW (*)    APPearance HAZY (*)    Bacteria, UA RARE (*)    Squamous Epithelial / LPF 0-5 (*)    All other components within normal limits  TROPONIN I - Abnormal; Notable for the following:    Troponin I 0.05 (*)    All other components within normal limits  LIPASE, BLOOD  PREGNANCY, URINE   ____________________________________________   EKG  Interpreted by me Normal sinus rhythm rate of 96, normal axis and intervals. Normal QRS ST segments and T waves.  ____________________________________________    RADIOLOGY  Ultrasound right upper quadrant reveals 3 mm gallbladder polyp, no gallstone or cholecystitis. CT abdomen and pelvis essentially unremarkable. Possible narrowing of the origin of celiac artery.  ____________________________________________   PROCEDURES   ____________________________________________   INITIAL IMPRESSION / ASSESSMENT AND PLAN / ED COURSE  Pertinent labs & imaging results that were available during my care of the patient  were reviewed by me and considered in my medical decision making (see chart for details).  Patient presents with severe generalized abdominal pain and tenderness. Unclear clinical etiology, although she is most tender in the right upper quadrant. We started with an ultrasound of the right upper quadrant which was negative for cholecystitis or significant obstruction. Labs are unremarkable. Proceed with CT scan because on recheck at 4:30 PM she still had diffuse tenderness. CT turned out to be unremarkable as well. The finding of a narrowed celiac artery origin is not consistent with her presentation as any gut ischemia should not cause some much tenderness. We'll have her follow up with primary care for  continued monitoring of her symptoms. Her troponin came back slightly elevated, but again the abdominal pain and tenderness is not consistent with a cardiopulmonary process. Based on her age and lack of comorbidities and nature of her symptoms, and very low suspicion for ACS PE carditis or dissection and do not think this requires any further workup. The troponin was ordered essentially erroneously by nursing protocol. At 9:00 PM the patient's pain is improved to 5 out of 10, tenderness is improved, vital signs are normal. Patient is amenable to discharge home and follow-up.    ____________________________________________   FINAL CLINICAL IMPRESSION(S) / ED DIAGNOSES  Final diagnoses:  Generalized abdominal pain       Portions of this note were generated with dragon dictation software. Dictation errors may occur despite best attempts at proofreading.   Sharman Cheek, MD 04/18/16 2107  Sharman Cheek, MD 04/18/16 2108

## 2016-04-18 NOTE — Discharge Instructions (Signed)

## 2016-04-18 NOTE — ED Notes (Signed)
Pt presents to ED with left and right upper quadrant abdominal pain. Pt reports vomiting. Pt denies diarrhea. Pt states she does not know if she has had a fever.

## 2016-04-18 NOTE — ED Notes (Signed)
Patient transported to Ultrasound 

## 2016-06-26 ENCOUNTER — Emergency Department (HOSPITAL_COMMUNITY)
Admission: EM | Admit: 2016-06-26 | Discharge: 2016-06-27 | Disposition: A | Discharge status: Home / Self Care | Payer: Self-pay | Attending: Emergency Medicine | Admitting: Emergency Medicine

## 2016-06-26 ENCOUNTER — Encounter (HOSPITAL_COMMUNITY): Payer: Self-pay | Admitting: *Deleted

## 2016-06-26 DIAGNOSIS — F17200 Nicotine dependence, unspecified, uncomplicated: Secondary | ICD-10-CM | POA: Insufficient documentation

## 2016-06-26 DIAGNOSIS — R112 Nausea with vomiting, unspecified: Secondary | ICD-10-CM | POA: Insufficient documentation

## 2016-06-26 DIAGNOSIS — R1084 Generalized abdominal pain: Secondary | ICD-10-CM | POA: Insufficient documentation

## 2016-06-26 DIAGNOSIS — Z79899 Other long term (current) drug therapy: Secondary | ICD-10-CM | POA: Insufficient documentation

## 2016-06-26 LAB — CBC
HCT: 47 % — ABNORMAL HIGH (ref 36.0–46.0)
Hemoglobin: 15.9 g/dL — ABNORMAL HIGH (ref 12.0–15.0)
MCH: 32.3 pg (ref 26.0–34.0)
MCHC: 33.8 g/dL (ref 30.0–36.0)
MCV: 95.5 fL (ref 78.0–100.0)
PLATELETS: 169 10*3/uL (ref 150–400)
RBC: 4.92 MIL/uL (ref 3.87–5.11)
RDW: 13.3 % (ref 11.5–15.5)
WBC: 10.6 10*3/uL — AB (ref 4.0–10.5)

## 2016-06-26 LAB — COMPREHENSIVE METABOLIC PANEL
ALK PHOS: 82 U/L (ref 38–126)
ALT: 54 U/L (ref 14–54)
AST: 38 U/L (ref 15–41)
Albumin: 4.4 g/dL (ref 3.5–5.0)
Anion gap: 8 (ref 5–15)
BILIRUBIN TOTAL: 1.5 mg/dL — AB (ref 0.3–1.2)
BUN: 10 mg/dL (ref 6–20)
CALCIUM: 9.4 mg/dL (ref 8.9–10.3)
CO2: 24 mmol/L (ref 22–32)
CREATININE: 0.85 mg/dL (ref 0.44–1.00)
Chloride: 106 mmol/L (ref 101–111)
GFR calc Af Amer: >60 mL/min (ref >60)
Glucose, Bld: 158 mg/dL — ABNORMAL HIGH (ref 65–99)
POTASSIUM: 3.6 mmol/L (ref 3.5–5.1)
Sodium: 138 mmol/L (ref 135–145)
TOTAL PROTEIN: 7.3 g/dL (ref 6.5–8.1)

## 2016-06-26 LAB — URINE MICROSCOPIC-ADD ON

## 2016-06-26 LAB — URINALYSIS, ROUTINE W REFLEX MICROSCOPIC
Glucose, UA: NEGATIVE mg/dL
Hgb urine dipstick: NEGATIVE
KETONES UR: 40 mg/dL — AB
LEUKOCYTES UA: NEGATIVE
NITRITE: NEGATIVE
PROTEIN: 30 mg/dL — AB
Specific Gravity, Urine: 1.032 — ABNORMAL HIGH (ref 1.005–1.030)
pH: 5.5 (ref 5.0–8.0)

## 2016-06-26 LAB — LIPASE, BLOOD: Lipase: 38 U/L (ref 11–51)

## 2016-06-26 LAB — POC URINE PREG, ED: Preg Test, Ur: NEGATIVE

## 2016-06-26 MED ORDER — HYDROMORPHONE HCL 1 MG/ML IJ SOLN
1.0000 mg | Freq: Once | INTRAMUSCULAR | Status: AC
  Administered 2016-06-27: 1 mg via INTRAVENOUS
  Filled 2016-06-26: qty 1

## 2016-06-26 MED ORDER — SODIUM CHLORIDE 0.9 % IV BOLUS (SEPSIS)
1000.0000 mL | Freq: Once | INTRAVENOUS | Status: AC
  Administered 2016-06-27: 1000 mL via INTRAVENOUS

## 2016-06-26 NOTE — ED Notes (Signed)
The pt is c/o abd pain with nv and diarrhea for 2 weeks  Hx of pancreatitis some gb problems.  lmp dec

## 2016-06-26 NOTE — ED Notes (Signed)
Pt no answer when called from waiting room for room placement. 

## 2016-06-27 ENCOUNTER — Emergency Department (HOSPITAL_COMMUNITY): Payer: Self-pay

## 2016-06-27 MED ORDER — OXYCODONE-ACETAMINOPHEN 5-325 MG PO TABS: 2.0000 | ORAL_TABLET | ORAL | Status: DC | PRN

## 2016-06-27 MED ORDER — ONDANSETRON HCL 4 MG PO TABS: 4.0000 mg | ORAL_TABLET | Freq: Four times a day (QID) | ORAL | Status: DC

## 2016-06-27 NOTE — ED Notes (Signed)
Patient transported to Ultrasound 

## 2016-06-27 NOTE — ED Provider Notes (Signed)
CSN: [id number]     Arrival date & time 06/26/16  2046 History   First MD Initiated Contact with Patient 06/26/16 2302     Chief Complaint  Patient presents with  . Abdominal Pain   HPI Comments: 39 year old female who presents with acute on chronic abdominal pain. She states it has been occuring for the past 3 weeks and been getting progressively worse. She states it is now wrapping around to her back bilaterally. She states that the pain has started in 2015 when she was in [place]. She went to the ER and was diagnosed with gallstone pancreatitis. She was established with a GI doctor and had a HIDA scan. She was told she needed a Liver biopsy however was scared and did not have that done. She moved to [facility]. She was seen in [place] in the ER for similar symptoms. CT of abdomen and pelvis in [place] showed possible narrowing of the celiac artery and fatty liver however no other acute abnormality. RUQ US showed cholelithiasis. Her abdominal pain is in the epigastric area and radiates under both ribs and wraps around to the back. The pain is constant but gets worse at times with meals and movement. She reports associated nausea and bilious vomiting with light colored stools. She denies fever, chills, chest pain, SOB, dysuria, vaginal discharge, vaginal bleeding. Denies alcohol use or drug use.  Patient is a 40 y.o. female presenting with abdominal pain.  Abdominal Pain Associated symptoms: diarrhea, nausea and vomiting   Associated symptoms: no chest pain, no chills, no constipation, no dysuria, no fever, no shortness of breath, no vaginal bleeding and no vaginal discharge     Past Medical History  Diagnosis Date  . Traumatic brain injury (HCC)   . Polycystic ovarian disease   . Fatty liver    Past Surgical History  Procedure Laterality Date  . Breast surgery    . Cyst removal leg     No family history on file. Social History  Substance Use Topics  . Smoking status: Light Tobacco Smoker  .  Smokeless tobacco: None  . Alcohol Use: No   OB History    No data available     Review of Systems  Constitutional: Negative for fever and chills.  Respiratory: Negative for shortness of breath.   Cardiovascular: Negative for chest pain.  Gastrointestinal: Positive for nausea, vomiting, abdominal pain and diarrhea. Negative for constipation and blood in stool.  Genitourinary: Negative for dysuria, vaginal bleeding, vaginal discharge and pelvic pain.  All other systems reviewed and are negative.     Allergies  Onion and Zithromax  Home Medications   Prior to Admission medications   Medication Sig Start Date End Date Taking? Authorizing Provider  ibuprofen (ADVIL,MOTRIN) 800 MG tablet Take 1 tablet (800 mg total) by mouth every 8 (eight) hours as needed for moderate pain. Patient not taking: Reported on 06/26/2016 09/09/15   Joni Reining, PA-C  methocarbamol (ROBAXIN-750) 750 MG tablet Take 2 tablets (1,500 mg total) by mouth 4 (four) times daily. Patient not taking: Reported on 06/26/2016 09/09/15   Joni Reining, PA-C  ondansetron (ZOFRAN ODT) 8 MG disintegrating tablet Take 1 tablet (8 mg total) by mouth every 8 (eight) hours as needed for nausea or vomiting. Patient not taking: Reported on 06/26/2016 04/18/16   Sharman Cheek, MD  oxyCODONE-acetaminophen (ROXICET) 5-325 MG tablet Take 1 tablet by mouth every 6 (six) hours as needed for severe pain. Patient not taking: Reported on 06/26/2016 04/18/16  Sharman CheekPhillip Stafford, MD  traMADol (ULTRAM) 50 MG tablet Take 1 tablet (50 mg total) by mouth every 6 (six) hours as needed for moderate pain. Patient not taking: Reported on 06/26/2016 09/09/15   Joni Reiningonald K Smith, PA-C   BP 124/86 mmHg  Pulse 94  Temp(Src) 98.2 F (36.8 C) (Oral)  Resp 20  Ht 5\' 6"  (1.676 m)  Wt 79.89 kg  BMI 28.44 kg/m2  SpO2 98%  LMP 12/27/2015   Physical Exam  Constitutional: She is oriented to person, place, and time. She appears well-developed and  well-nourished. No distress.  HENT:  Head: Normocephalic and atraumatic.  Eyes: Conjunctivae are normal. Pupils are equal, round, and reactive to light. Right eye exhibits no discharge. Left eye exhibits no discharge. No scleral icterus.  Neck: Normal range of motion.  Cardiovascular: Normal rate and regular rhythm.  Exam reveals no gallop and no friction rub.   No murmur heard. Pulmonary/Chest: Effort normal and breath sounds normal. No respiratory distress. She has no wheezes. She has no rales. She exhibits no tenderness.  Abdominal: Soft. Bowel sounds are normal. She exhibits no distension and no mass. There is tenderness. There is no rebound and no guarding.  RUQ, epigastric, and LUQ tenderness  Neurological: She is alert and oriented to person, place, and time.  Skin: Skin is warm and dry.  Psychiatric: Her speech is normal and behavior is normal. Her mood appears anxious.    ED Course  Procedures (including critical care time) Labs Review Labs Reviewed  URINE CULTURE - Abnormal; Notable for the following:    Culture 2,000 COLONIES/mL INSIGNIFICANT GROWTH (*)    All other components within normal limits  COMPREHENSIVE METABOLIC PANEL - Abnormal; Notable for the following:    Glucose, Bld 158 (*)    Total Bilirubin 1.5 (*)    All other components within normal limits  CBC - Abnormal; Notable for the following:    WBC 10.6 (*)    Hemoglobin 15.9 (*)    HCT 47.0 (*)    All other components within normal limits  URINALYSIS, ROUTINE W REFLEX MICROSCOPIC (NOT AT [facility]) - Abnormal; Notable for the following:    Color, Urine AMBER (*)    APPearance CLOUDY (*)    Specific Gravity, Urine 1.032 (*)    Bilirubin Urine SMALL (*)    Ketones, ur 40 (*)    Protein, ur 30 (*)    All other components within normal limits  URINE MICROSCOPIC-ADD ON - Abnormal; Notable for the following:    Squamous Epithelial / LPF 6-30 (*)    Bacteria, UA MANY (*)    All other components within normal  limits  LIPASE, BLOOD  POC URINE PREG, ED    Imaging Review [place] Abdomen Complete  06/27/2016  CLINICAL DATA:  Right upper quadrant and epigastric pain for 2 weeks EXAM: ABDOMEN ULTRASOUND COMPLETE COMPARISON:  CT 04/18/2016 FINDINGS: Gallbladder: Cholelithiasis measuring up to 3 mm, mobile. The patient was not tender over the gallbladder. No gallbladder mural thickening or pericholecystic fluid. Common bile duct: Diameter: 3 mm Liver: Diffusely increased echogenicity of hepatic parenchyma without focal lesion. This may represent fatty infiltration. IVC: No abnormality visualized. Pancreas: Visualized portion unremarkable. Spleen: Size and appearance within normal limits. Right Kidney: Length: 11.9 cm. Normal parenchymal thickness and echotexture. 10 mm cyst noted. Left Kidney: Length: 11.6 cm per. Echogenicity within normal limits. No mass or hydronephrosis visualized. Abdominal aorta: No aneurysm visualized. Other findings: None. IMPRESSION: Cholelithiasis without sonographic evidence of cholecystitis. Fatty  liver. Electronically Signed   By: Ellery Plunkaniel R Mitchell M.D.   On: 06/27/2016 01:18   I have personally reviewed and evaluated these images and lab results as part of my medical decision-making.   EKG Interpretation   Date/Time:  Wednesday June 26 2016 20:56:46 EDT Ventricular Rate:  123 PR Interval:  142 QRS Duration: 70 QT Interval:  314 QTC Calculation: 449 R Axis:   83 Text Interpretation:  Sinus tachycardia Right atrial enlargement  Borderline ECG When compared with ECG of 04/18/2016, HEART RATE has  increased Confirmed by [facility]  MD, DAVID ([id number]) on 06/26/2016 11:25:18 PM      MDM   Final diagnoses:  Generalized abdominal pain  Non-intractable vomiting with nausea, vomiting of unspecified type   40 year old female presents with acute on chronic abdominal pain. Unclear etiology. Repeat abdominal US today again showed cholelithiasis and fatty liver but no other acute abnormality.  She was initially tachycardic - this improved on recheck. All other vitals are WNL and stable. UA shows bilirubin, ketones, protein, and many bacteria. Pt has no urinary complaints. Will send culture. Lipase is normal. CMP is remarkable for bilirubin of 1.5 and hyperglycemia. CBC remarkable for mild leukocytosis. EKG shows sinus tachycardia. Patient was given pain medicine and nausea medicine with relief. Discussed that I do feel she needs further work up however I don't feel that it's anything emergent. Referral to GI given along with rx for pain and nausea medicine. Patient is NAD, non-toxic, with stable VS. Patient is informed of clinical course, understands medical decision making process, and agrees with plan. Opportunity for questions provided and all questions answered. Return precautions given.       Bethel BornKelly Marie Arek Spadafore, PA-C 06/28/16 1736  Dione Boozeavid Glick, MD 07/01/16 [phone]

## 2016-06-27 NOTE — Discharge Instructions (Signed)

## 2016-06-28 LAB — URINE CULTURE

## 2016-08-28 ENCOUNTER — Emergency Department (HOSPITAL_COMMUNITY): Payer: Self-pay

## 2016-08-28 ENCOUNTER — Emergency Department (HOSPITAL_COMMUNITY)
Admission: EM | Admit: 2016-08-28 | Discharge: 2016-08-28 | Disposition: A | Discharge status: Home / Self Care | Payer: Self-pay | Attending: Emergency Medicine | Admitting: Emergency Medicine

## 2016-08-28 ENCOUNTER — Encounter (HOSPITAL_COMMUNITY): Payer: Self-pay | Admitting: *Deleted

## 2016-08-28 DIAGNOSIS — K8020 Calculus of gallbladder without cholecystitis without obstruction: Secondary | ICD-10-CM | POA: Insufficient documentation

## 2016-08-28 DIAGNOSIS — K8050 Calculus of bile duct without cholangitis or cholecystitis without obstruction: Secondary | ICD-10-CM

## 2016-08-28 DIAGNOSIS — F17200 Nicotine dependence, unspecified, uncomplicated: Secondary | ICD-10-CM | POA: Insufficient documentation

## 2016-08-28 DIAGNOSIS — R1084 Generalized abdominal pain: Secondary | ICD-10-CM

## 2016-08-28 LAB — COMPREHENSIVE METABOLIC PANEL WITH GFR
ALT: 44 U/L (ref 14–54)
AST: 26 U/L (ref 15–41)
Albumin: 3.8 g/dL (ref 3.5–5.0)
Alkaline Phosphatase: 69 U/L (ref 38–126)
Anion gap: 5 (ref 5–15)
BUN: 7 mg/dL (ref 6–20)
CO2: 24 mmol/L (ref 22–32)
Calcium: 8.9 mg/dL (ref 8.9–10.3)
Chloride: 112 mmol/L — ABNORMAL HIGH (ref 101–111)
Creatinine, Ser: 0.76 mg/dL (ref 0.44–1.00)
GFR calc Af Amer: >60 mL/min
GFR calc non Af Amer: >60 mL/min
Glucose, Bld: 94 mg/dL (ref 65–99)
Potassium: 4 mmol/L (ref 3.5–5.1)
Sodium: 141 mmol/L (ref 135–145)
Total Bilirubin: 0.9 mg/dL (ref 0.3–1.2)
Total Protein: 6.7 g/dL (ref 6.5–8.1)

## 2016-08-28 LAB — URINALYSIS, ROUTINE W REFLEX MICROSCOPIC
BILIRUBIN URINE: NEGATIVE
Glucose, UA: NEGATIVE mg/dL
HGB URINE DIPSTICK: NEGATIVE
Ketones, ur: NEGATIVE mg/dL
Leukocytes, UA: NEGATIVE
Nitrite: NEGATIVE
PROTEIN: NEGATIVE mg/dL
Specific Gravity, Urine: 1.024 (ref 1.005–1.030)
pH: 5 (ref 5.0–8.0)

## 2016-08-28 LAB — LIPASE, BLOOD: Lipase: 45 U/L (ref 11–51)

## 2016-08-28 LAB — CBC
HCT: 48 % — ABNORMAL HIGH (ref 36.0–46.0)
Hemoglobin: 16 g/dL — ABNORMAL HIGH (ref 12.0–15.0)
MCH: 32.3 pg (ref 26.0–34.0)
MCHC: 33.3 g/dL (ref 30.0–36.0)
MCV: 97 fL (ref 78.0–100.0)
PLATELETS: 174 10*3/uL (ref 150–400)
RBC: 4.95 MIL/uL (ref 3.87–5.11)
RDW: 13.9 % (ref 11.5–15.5)
WBC: 8.5 10*3/uL (ref 4.0–10.5)

## 2016-08-28 LAB — POC URINE PREG, ED: PREG TEST UR: NEGATIVE

## 2016-08-28 MED ORDER — OXYCODONE-ACETAMINOPHEN 5-325 MG PO TABS: 1.0000 | ORAL_TABLET | Freq: Three times a day (TID) | ORAL | 0 refills | Status: DC | PRN

## 2016-08-28 MED ORDER — GI COCKTAIL ~~LOC~~
30.0000 mL | Freq: Once | ORAL | Status: AC
  Administered 2016-08-28: 30 mL via ORAL
  Filled 2016-08-28: qty 30

## 2016-08-28 MED ORDER — SUCRALFATE 1 G PO TABS: 1.0000 g | ORAL_TABLET | Freq: Three times a day (TID) | ORAL | 0 refills | Status: DC

## 2016-08-28 MED ORDER — ONDANSETRON HCL 4 MG/2ML IJ SOLN
4.0000 mg | Freq: Once | INTRAMUSCULAR | Status: AC
  Administered 2016-08-28: 4 mg via INTRAVENOUS
  Filled 2016-08-28: qty 2

## 2016-08-28 MED ORDER — MORPHINE SULFATE (PF) 4 MG/ML IV SOLN
4.0000 mg | Freq: Once | INTRAVENOUS | Status: AC
  Administered 2016-08-28: 4 mg via INTRAVENOUS
  Filled 2016-08-28: qty 1

## 2016-08-28 MED ORDER — SODIUM CHLORIDE 0.9 % IV BOLUS (SEPSIS)
1000.0000 mL | Freq: Once | INTRAVENOUS | Status: AC
  Administered 2016-08-28: 1000 mL via INTRAVENOUS

## 2016-08-28 MED ORDER — ONDANSETRON 4 MG PO TBDP: 4.0000 mg | ORAL_TABLET | Freq: Three times a day (TID) | ORAL | 0 refills | Status: DC | PRN

## 2016-08-28 MED ORDER — PROMETHAZINE HCL 25 MG PO TABS: 25.0000 mg | ORAL_TABLET | Freq: Three times a day (TID) | ORAL | 0 refills | Status: DC | PRN

## 2016-08-28 MED ORDER — PANTOPRAZOLE SODIUM 20 MG PO TBEC: 20.0000 mg | DELAYED_RELEASE_TABLET | Freq: Every day | ORAL | 0 refills | Status: DC

## 2016-08-28 MED ORDER — OXYCODONE-ACETAMINOPHEN 5-325 MG PO TABS
1.0000 | ORAL_TABLET | Freq: Once | ORAL | Status: AC
  Administered 2016-08-28: 1 via ORAL
  Filled 2016-08-28: qty 1

## 2016-08-28 NOTE — ED Triage Notes (Signed)
The pt has known gallstones  She is c/o rt upper abd pain for 2 days with nausea  lmp dec

## 2016-08-28 NOTE — ED Notes (Signed)
Pt provided with d/c instructions at this time. Pt verbalizes understanding of d/c instructions as well as follow up procedure after d/c.  Pt provided with RX for carafate, protonix, zofran, phenergan, percocet. Pt verbalizes understanding of RX directions. Pt in no apparent distress at this time. Pt ambulatory at time of d/c.  Pt declined to e-sign her d/c at this time.

## 2016-08-28 NOTE — ED Provider Notes (Signed)
[facility]-EMERGENCY DEPT Provider Note   CSN: [id number] Arrival date & time: 08/28/16  1745     History   Chief Complaint Chief Complaint  Patient presents with  . Abdominal Pain    HPI Paula Cherry is a 40 y.o. female.  HPI   Patient is a 40 year old female with history of pacer S, TBI, fatty liver, presents emergency Department with epigastric and right upper quadrant abdominal pain which radiates to her left upper quadrant and back. Has been gradually worsening over the past 3 days, worse with eating, associated with nausea, vomiting, sweats and subjective fevers. Pain is severe, 10/10, sharp and squeezing associated with abdominal distention and pressure, worse with eating, movement or palpation, no alleviating factors. Emesis is described as stomach contents and then bile.  No hematemesis.  She is having normal BM's however two months ago they turned light gray and have remained that color.  She has decreased appetite, no urinary or vaginal sx.  No CP, SOB.  She reports a history of gallstone pancreatitis approximately one year ago while in [place], and has several recent flares this year, however this is more severe.  She has not seen a Careers adviser, states she was never referred to one.   Past Medical History:  Diagnosis Date  . Fatty liver   . Polycystic ovarian disease   . Traumatic brain injury (HCC)     There are no active problems to display for this patient.   Past Surgical History:  Procedure Laterality Date  . BREAST SURGERY    . CYST REMOVAL LEG      OB History    No data available       Home Medications    Prior to Admission medications   Medication Sig Start Date End Date Taking? Authorizing Provider  amitriptyline (ELAVIL) 25 MG tablet Take 50 mg by mouth at bedtime.  10/24/15  Yes Historical Provider, MD  ibuprofen (ADVIL,MOTRIN) 200 MG tablet Take 400 mg by mouth every 6 (six) hours as needed for mild pain or moderate pain.   Yes Historical Provider, MD    naproxen sodium (ALEVE) 220 MG tablet Take 220-440 mg by mouth 2 (two) times daily as needed (for headaches).   Yes Historical Provider, MD  ondansetron (ZOFRAN ODT) 4 MG disintegrating tablet Take 1 tablet (4 mg total) by mouth every 8 (eight) hours as needed for nausea or vomiting. 08/28/16   Danelle Berry, PA-C  oxyCODONE-acetaminophen (PERCOCET) 5-325 MG tablet Take 1-2 tablets by mouth every 8 (eight) hours as needed for severe pain. 08/28/16   Danelle Berry, PA-C  pantoprazole (PROTONIX) 20 MG tablet Take 1 tablet (20 mg total) by mouth daily. 08/28/16   Danelle Berry, PA-C  promethazine (PHENERGAN) 25 MG tablet Take 1 tablet (25 mg total) by mouth every 8 (eight) hours as needed for refractory nausea / vomiting. 08/28/16   Danelle Berry, PA-C  sucralfate (CARAFATE) 1 g tablet Take 1 tablet (1 g total) by mouth 4 (four) times daily -  with meals and at bedtime. 08/28/16   Danelle Berry, PA-C    Family History No family history on file.  Social History Social History  Substance Use Topics  . Smoking status: Light Tobacco Smoker  . Smokeless tobacco: Never Used  . Alcohol use No     Allergies   Onion and Zithromax [azithromycin]   Review of Systems Review of Systems  All other systems reviewed and are negative.    Physical Exam Updated Vital Signs BP 107/77 (  BP Location: Right Arm)   Pulse 75   Temp 98.8 F (37.1 C) (Oral)   Resp 18   Ht 5\' 6"  (1.676 m)   Wt 83 kg   LMP 12/29/2015 Comment: neg. preg test today  SpO2 98%   BMI 29.54 kg/m   Physical Exam  Constitutional: She is oriented to person, place, and time. She appears well-developed and well-nourished. No distress.  Mildly distressed female secondary to appearing extremely uncomfortable, intermittently tearful, no respiratory distress, non-toxic appearing  HENT:  Head: Normocephalic and atraumatic.  Nose: Nose normal.  Oral mucosa dry  Eyes: Conjunctivae are normal. Pupils are equal, round, and reactive to light. Right  eye exhibits no discharge. Left eye exhibits no discharge. No scleral icterus.  Neck: Normal range of motion. Neck supple.  Cardiovascular: Normal rate, regular rhythm, normal heart sounds and intact distal pulses.  Exam reveals no gallop and no friction rub.   No murmur heard. Pulmonary/Chest: Effort normal. No stridor. No respiratory distress. She has no wheezes. She has no rales. She exhibits no tenderness.  Abdominal: She exhibits distension. She exhibits no mass. There is tenderness. There is guarding.  Moderately distended abdomen, diffusely tender to palpation, with voluntary guarding, hypoactive bowel sounds  Musculoskeletal: Normal range of motion.  Neurological: She is alert and oriented to person, place, and time. She exhibits normal muscle tone. Coordination normal.  Skin: Skin is warm. Capillary refill takes less than 2 seconds. She is not diaphoretic.  Flushed dry skin, no rash, no pallor  Psychiatric: She has a normal mood and affect. Her behavior is normal. Judgment and thought content normal.     ED Treatments / Results  Labs (all labs ordered are listed, but only abnormal results are displayed) Labs Reviewed  COMPREHENSIVE METABOLIC PANEL - Abnormal; Notable for the following:       Result Value   Chloride 112 (*)    All other components within normal limits  CBC - Abnormal; Notable for the following:    Hemoglobin 16.0 (*)    HCT 48.0 (*)    All other components within normal limits  LIPASE, BLOOD  URINALYSIS, ROUTINE W REFLEX MICROSCOPIC (NOT AT [facility])  POC URINE PREG, ED    EKG  EKG Interpretation None       Radiology Dg Abd 1 View  Result Date: 08/28/2016 CLINICAL DATA:  Abdominal pain with nausea and vomiting for 2 weeks. Pain has been present for years but worse recently. EXAM: ABDOMEN - 1 VIEW COMPARISON:  Ultrasound abdomen 08/28/2016. CT abdomen and pelvis 04/18/2016. FINDINGS: Diffusely stool-filled colon. No small or large bowel distention. No  radiopaque stones identified. Calcifications in the pelvis are consistent with phleboliths. Degenerative changes in the spine and hips. IMPRESSION: Normal nonobstructive bowel gas pattern with diffusely stool-filled colon. Electronically Signed   By: Burman NievesWilliam  Stevens M.D.   On: 08/28/2016 20:38   [place] Abdomen Limited Ruq  Result Date: 08/28/2016 CLINICAL DATA:  Right upper quadrant pain for 3 days. Known history of gallstones. Prior history of pancreatitis and fatty liver EXAM: US ABDOMEN LIMITED - RIGHT UPPER QUADRANT COMPARISON:  06/27/2016 FINDINGS: Gallbladder: Small mobile stones are demonstrated in the gallbladder, largest measuring 4 mm. No gallbladder wall thickening or edema. Murphy's sign is negative. Common bile duct: Diameter: 3.2 mm, normal Liver: Diffusely increased hepatic parenchymal echotexture consistent with diffuse fatty infiltration. No focal lesions identified. No significant changes since prior study. IMPRESSION: Cholelithiasis without additional changes to suggest cholecystitis. Diffuse fatty infiltration of the  liver. Electronically Signed   By: Burman Nieves M.D.   On: 08/28/2016 19:22    Procedures Procedures (including critical care time)  Medications Ordered in ED Medications  sodium chloride 0.9 % bolus 1,000 mL (0 mLs Intravenous Stopped 08/28/16 2040)  ondansetron (ZOFRAN) injection 4 mg (4 mg Intravenous Given 08/28/16 1827)  morphine 4 MG/ML injection 4 mg (4 mg Intravenous Given 08/28/16 1828)  morphine 4 MG/ML injection 4 mg (4 mg Intravenous Given 08/28/16 1847)  oxyCODONE-acetaminophen (PERCOCET/ROXICET) 5-325 MG per tablet 1 tablet (1 tablet Oral Given 08/28/16 2058)  gi cocktail (Maalox,Lidocaine,Donnatal) (30 mLs Oral Given 08/28/16 2058)     Initial Impression / Assessment and Plan / ED Course  I have reviewed the triage vital signs and the nursing notes.  Pertinent labs & imaging results that were available during my care of the patient were reviewed by  me and considered in my medical decision making (see chart for details).  Clinical Course  Patient with history of cholelithiasis, acute gallstone pancreatitis, and intermittent biliary colic over the past several months presents with worsening upper abdominal pain with radiation to her back associated with nausea and vomiting, worse with eating, also associated with distended abdomen.  She is non-toxic appearing, but looks extremely uncomfortable, unable to hold still in bed, abd is moderately distended, diffusely ttp.  Will treat with IV fluids, antiemetics, pain meds, evaluate with RUQ [place] and KUB.  Laboratory work was unremarkable including normal white count, no elevation of LFTs or lipase, right upper quadrant ultrasound part of her small mobile stones and gallbladder no wall thickening or edema, no common bile duct dilatation, again diffuse fatty liver.    Patient's pain was managed in the ER after multiple IV doses of morphine and Percocet, patient was able to tolerate PO's without further emesis.  Patient was frustrated with results of workup and being told she was going to be discharged home.  He results were reviewed with her.  She was given surgical referral.  Wanda encasement as well as contacted to help establish follow-up for the patient.  She then stated that she does have insurance and she has not attempted to get a PCP.  She has been dealing with the same symptoms intermittently for over a year.  Burna Mortimer and I discussed appropriate outpatient work up, and states she could go to to the surgeon or she could seek out a PCP and then get referral.  She then complained of taking Tums constantly.  I had already prescribed her protonix trial for tx of possible PUD.  I also added on GI referral.  Burna Mortimer and I attempted to explain that she does not have any emergent medical condition currently, I'm unable to admit her and unable to obtain an emergent surgical consult. She was encouraged to work on this  outpatient, and was given pain medication and 2 different antiemetics, PPI trial and Carafate.    Return precautions reviewed.  Pt's vitals have been stable while in the ER.  She was hydrated with IVF. Pain aggressively treated.  Tolerating PO's.  Feel she is appropriate to d/c home and continue outpatient work up of abdominal pain and bloating, and biliary colic.  Final Clinical Impressions(s) / ED Diagnoses   Final diagnoses:  Biliary colic  Calculus of gallbladder without cholecystitis without obstruction  Generalized abdominal pain    New Prescriptions Discharge Medication List as of 08/28/2016  9:35 PM    START taking these medications   Details  ondansetron [facility]  ODT) 4 MG disintegrating tablet Take 1 tablet (4 mg total) by mouth every 8 (eight) hours as needed for nausea or vomiting., Starting Wed 08/28/2016, Print    pantoprazole (PROTONIX) 20 MG tablet Take 1 tablet (20 mg total) by mouth daily., Starting Wed 08/28/2016, Print    promethazine (PHENERGAN) 25 MG tablet Take 1 tablet (25 mg total) by mouth every 8 (eight) hours as needed for refractory nausea / vomiting., Starting Wed 08/28/2016, Print    sucralfate (CARAFATE) 1 g tablet Take 1 tablet (1 g total) by mouth 4 (four) times daily -  with meals and at bedtime., Starting Wed 08/28/2016, Print         Danelle Berry, PA-C 08/29/16 1610    Gerhard Munch, MD 08/29/16 [phone]

## 2016-08-28 NOTE — ED Notes (Signed)
Pt provided with 8 oz of ice water at this time.  Pt tolerating PO intake without complaint at this time.

## 2016-09-11 ENCOUNTER — Encounter (HOSPITAL_COMMUNITY): Payer: Self-pay | Admitting: Emergency Medicine

## 2016-09-11 DIAGNOSIS — F17200 Nicotine dependence, unspecified, uncomplicated: Secondary | ICD-10-CM | POA: Insufficient documentation

## 2016-09-11 DIAGNOSIS — Y999 Unspecified external cause status: Secondary | ICD-10-CM | POA: Diagnosis not present

## 2016-09-11 DIAGNOSIS — Y939 Activity, unspecified: Secondary | ICD-10-CM | POA: Diagnosis not present

## 2016-09-11 DIAGNOSIS — Y929 Unspecified place or not applicable: Secondary | ICD-10-CM | POA: Diagnosis not present

## 2016-09-11 DIAGNOSIS — J3489 Other specified disorders of nose and nasal sinuses: Secondary | ICD-10-CM | POA: Insufficient documentation

## 2016-09-11 DIAGNOSIS — Z5321 Procedure and treatment not carried out due to patient leaving prior to being seen by health care provider: Secondary | ICD-10-CM | POA: Insufficient documentation

## 2016-09-11 DIAGNOSIS — R51 Headache: Secondary | ICD-10-CM | POA: Insufficient documentation

## 2016-09-11 DIAGNOSIS — M79603 Pain in arm, unspecified: Secondary | ICD-10-CM | POA: Insufficient documentation

## 2016-09-11 LAB — CBC WITH DIFFERENTIAL/PLATELET
Basophils Absolute: 0 10*3/uL (ref 0.0–0.1)
Basophils Relative: 0 %
Eosinophils Absolute: 0.1 10*3/uL (ref 0.0–0.7)
Eosinophils Relative: 1 %
HCT: 49.1 % — ABNORMAL HIGH (ref 36.0–46.0)
Hemoglobin: 16.7 g/dL — ABNORMAL HIGH (ref 12.0–15.0)
Lymphocytes Relative: 13 %
Lymphs Abs: 1.8 10*3/uL (ref 0.7–4.0)
MCH: 32.2 pg (ref 26.0–34.0)
MCHC: 34 g/dL (ref 30.0–36.0)
MCV: 94.6 fL (ref 78.0–100.0)
Monocytes Absolute: 0.5 10*3/uL (ref 0.1–1.0)
Monocytes Relative: 3 %
Neutro Abs: 11.7 10*3/uL — ABNORMAL HIGH (ref 1.7–7.7)
Neutrophils Relative %: 83 %
Platelets: 193 10*3/uL (ref 150–400)
RBC: 5.19 MIL/uL — ABNORMAL HIGH (ref 3.87–5.11)
RDW: 13.5 % (ref 11.5–15.5)
WBC: 14 10*3/uL — ABNORMAL HIGH (ref 4.0–10.5)

## 2016-09-11 LAB — COMPREHENSIVE METABOLIC PANEL
ALT: 48 U/L (ref 14–54)
AST: 36 U/L (ref 15–41)
Albumin: 4.3 g/dL (ref 3.5–5.0)
Alkaline Phosphatase: 76 U/L (ref 38–126)
Anion gap: 9 (ref 5–15)
BUN: 7 mg/dL (ref 6–20)
CO2: 22 mmol/L (ref 22–32)
Calcium: 9.7 mg/dL (ref 8.9–10.3)
Chloride: 109 mmol/L (ref 101–111)
Creatinine, Ser: 0.86 mg/dL (ref 0.44–1.00)
GFR calc Af Amer: >60 mL/min (ref >60)
GFR calc non Af Amer: >60 mL/min (ref >60)
Glucose, Bld: 133 mg/dL — ABNORMAL HIGH (ref 65–99)
Potassium: 4.8 mmol/L (ref 3.5–5.1)
Sodium: 140 mmol/L (ref 135–145)
Total Bilirubin: 1.3 mg/dL — ABNORMAL HIGH (ref 0.3–1.2)
Total Protein: 7.4 g/dL (ref 6.5–8.1)

## 2016-09-11 LAB — URINALYSIS, ROUTINE W REFLEX MICROSCOPIC
Bilirubin Urine: NEGATIVE
Glucose, UA: NEGATIVE mg/dL
Hgb urine dipstick: NEGATIVE
Ketones, ur: NEGATIVE mg/dL
Leukocytes, UA: NEGATIVE
Nitrite: NEGATIVE
Protein, ur: 30 mg/dL — AB
Specific Gravity, Urine: 1.019 (ref 1.005–1.030)
pH: 5 (ref 5.0–8.0)

## 2016-09-11 LAB — URINE MICROSCOPIC-ADD ON

## 2016-09-11 LAB — POC URINE PREG, ED: Preg Test, Ur: NEGATIVE
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
## 2016-09-11 NOTE — ED Notes (Signed)
Pt was called at 2202 for reassessment, no response.

## 2016-09-11 NOTE — ED Notes (Signed)
Pt stepped outside.  

## 2016-09-11 NOTE — ED Notes (Signed)
Called pt for reassessment at 2329, no response

## 2016-09-12 ENCOUNTER — Emergency Department (HOSPITAL_COMMUNITY)
Admission: EM | Admit: 2016-09-12 | Discharge: 2016-09-12 | Disposition: A | Discharge status: Home / Self Care | Payer: BLUE CROSS/BLUE SHIELD | Attending: Emergency Medicine | Admitting: Emergency Medicine

## 2016-09-12 NOTE — ED Notes (Signed)
Called for reassess vitals no response

## 2016-09-12 NOTE — ED Notes (Signed)
Patient not in the waiting area or outside.  [employer] aware

## 2016-10-21 ENCOUNTER — Encounter: Payer: Self-pay | Admitting: *Deleted

## 2016-10-21 ENCOUNTER — Emergency Department: Payer: Self-pay

## 2016-10-21 ENCOUNTER — Emergency Department
Admission: EM | Admit: 2016-10-21 | Discharge: 2016-10-22 | Disposition: A | Discharge status: Home / Self Care | Payer: Self-pay | Attending: Emergency Medicine | Admitting: Emergency Medicine

## 2016-10-21 DIAGNOSIS — Z79899 Other long term (current) drug therapy: Secondary | ICD-10-CM | POA: Insufficient documentation

## 2016-10-21 DIAGNOSIS — F17200 Nicotine dependence, unspecified, uncomplicated: Secondary | ICD-10-CM | POA: Insufficient documentation

## 2016-10-21 DIAGNOSIS — R197 Diarrhea, unspecified: Secondary | ICD-10-CM | POA: Insufficient documentation

## 2016-10-21 DIAGNOSIS — R112 Nausea with vomiting, unspecified: Secondary | ICD-10-CM | POA: Insufficient documentation

## 2016-10-21 DIAGNOSIS — R1010 Upper abdominal pain, unspecified: Secondary | ICD-10-CM | POA: Insufficient documentation

## 2016-10-21 HISTORY — DX: Type 2 diabetes mellitus without complications: E11.9

## 2016-10-21 LAB — COMPREHENSIVE METABOLIC PANEL
ALBUMIN: 4.3 g/dL (ref 3.5–5.0)
ALK PHOS: 70 U/L (ref 38–126)
ALT: 49 U/L (ref 14–54)
AST: 31 U/L (ref 15–41)
Anion gap: 7 (ref 5–15)
BILIRUBIN TOTAL: 0.7 mg/dL (ref 0.3–1.2)
BUN: 10 mg/dL (ref 6–20)
CALCIUM: 9 mg/dL (ref 8.9–10.3)
CO2: 23 mmol/L (ref 22–32)
CREATININE: 0.72 mg/dL (ref 0.44–1.00)
Chloride: 109 mmol/L (ref 101–111)
GFR calc Af Amer: >60 mL/min (ref >60)
GFR calc non Af Amer: >60 mL/min (ref >60)
GLUCOSE: 128 mg/dL — AB (ref 65–99)
Potassium: 4.1 mmol/L (ref 3.5–5.1)
SODIUM: 139 mmol/L (ref 135–145)
Total Protein: 7.3 g/dL (ref 6.5–8.1)

## 2016-10-21 LAB — URINALYSIS COMPLETE WITH MICROSCOPIC (ARMC ONLY)
BILIRUBIN URINE: NEGATIVE
GLUCOSE, UA: NEGATIVE mg/dL
Hgb urine dipstick: NEGATIVE
KETONES UR: NEGATIVE mg/dL
Nitrite: NEGATIVE
PROTEIN: NEGATIVE mg/dL
Specific Gravity, Urine: 1.019 (ref 1.005–1.030)
pH: 6 (ref 5.0–8.0)

## 2016-10-21 LAB — CBC
HCT: 47.7 % — ABNORMAL HIGH (ref 35.0–47.0)
Hemoglobin: 16.2 g/dL — ABNORMAL HIGH (ref 12.0–16.0)
MCH: 32.7 pg (ref 26.0–34.0)
MCHC: 34 g/dL (ref 32.0–36.0)
MCV: 96.1 fL (ref 80.0–100.0)
PLATELETS: 163 10*3/uL (ref 150–440)
RBC: 4.97 MIL/uL (ref 3.80–5.20)
RDW: 13.7 % (ref 11.5–14.5)
WBC: 10.8 10*3/uL (ref 3.6–11.0)

## 2016-10-21 LAB — POCT PREGNANCY, URINE: Preg Test, Ur: NEGATIVE

## 2016-10-21 LAB — LIPASE, BLOOD: Lipase: 51 U/L (ref 11–51)

## 2016-10-21 MED ORDER — SODIUM CHLORIDE 0.9 % IV SOLN
1000.0000 mL | Freq: Once | INTRAVENOUS | Status: AC
  Administered 2016-10-21: 1000 mL via INTRAVENOUS

## 2016-10-21 MED ORDER — ONDANSETRON HCL 4 MG/2ML IJ SOLN
4.0000 mg | Freq: Once | INTRAMUSCULAR | Status: AC
  Administered 2016-10-21: 4 mg via INTRAVENOUS
  Filled 2016-10-21: qty 2

## 2016-10-21 MED ORDER — MORPHINE SULFATE (PF) 2 MG/ML IV SOLN
4.0000 mg | Freq: Once | INTRAVENOUS | Status: AC
  Administered 2016-10-21: 4 mg via INTRAVENOUS
  Filled 2016-10-21: qty 2

## 2016-10-21 MED ORDER — KETOROLAC TROMETHAMINE 30 MG/ML IJ SOLN
30.0000 mg | Freq: Once | INTRAMUSCULAR | Status: AC
  Administered 2016-10-21: 30 mg via INTRAVENOUS

## 2016-10-21 MED ORDER — KETOROLAC TROMETHAMINE 30 MG/ML IJ SOLN: 30.0000 mg | Freq: Once | INTRAMUSCULAR | Status: DC

## 2016-10-21 MED ORDER — KETOROLAC TROMETHAMINE 30 MG/ML IJ SOLN
INTRAMUSCULAR | Status: AC
  Administered 2016-10-21: 30 mg via INTRAVENOUS
  Filled 2016-10-21: qty 1

## 2016-10-21 MED ORDER — IOPAMIDOL (ISOVUE-300) INJECTION 61%
100.0000 mL | Freq: Once | INTRAVENOUS | Status: AC | PRN
  Administered 2016-10-22: 100 mL via INTRAVENOUS

## 2016-10-21 MED ORDER — IOPAMIDOL (ISOVUE-300) INJECTION 61%
30.0000 mL | Freq: Once | INTRAVENOUS | Status: AC | PRN
  Administered 2016-10-21: 30 mL via ORAL

## 2016-10-21 NOTE — ED Provider Notes (Signed)
[facility] Emergency Department Provider Note   ____________________________________________    I have reviewed the triage vital signs and the nursing notes.   HISTORY  Chief Complaint Abdominal Pain     HPI Paula Cherry is a 40 y.o. female who presents with complaints of abdominal pain, nausea and diarrhea. Patient reports over the last 3 days she has felt bloated with frequent episodes of diarrhea and nausea and vomiting. She denies fevers or chills. No dysuria. No sick contacts reported. No hematemesis. She reports abdominal pain is crampy moderate and primarily in the epigastrium. She has never had this before.   Past Medical History:  Diagnosis Date  . Fatty liver   . Polycystic ovarian disease   . Traumatic brain injury (HCC)     There are no active problems to display for this patient.   Past Surgical History:  Procedure Laterality Date  . BREAST SURGERY    . CYST REMOVAL LEG      Prior to Admission medications   Medication Sig Start Date End Date Taking? Authorizing Provider  amitriptyline (ELAVIL) 25 MG tablet Take 50 mg by mouth at bedtime.  10/24/15   Historical Provider, MD  ibuprofen (ADVIL,MOTRIN) 200 MG tablet Take 400 mg by mouth every 6 (six) hours as needed for mild pain or moderate pain.    Historical Provider, MD  naproxen sodium (ALEVE) 220 MG tablet Take 220-440 mg by mouth 2 (two) times daily as needed (for headaches).    Historical Provider, MD  ondansetron (ZOFRAN ODT) 4 MG disintegrating tablet Take 1 tablet (4 mg total) by mouth every 8 (eight) hours as needed for nausea or vomiting. 08/28/16   Danelle Berry, PA-C  oxyCODONE-acetaminophen (PERCOCET) 5-325 MG tablet Take 1-2 tablets by mouth every 8 (eight) hours as needed for severe pain. 08/28/16   Danelle Berry, PA-C  pantoprazole (PROTONIX) 20 MG tablet Take 1 tablet (20 mg total) by mouth daily. 08/28/16   Danelle Berry, PA-C  promethazine (PHENERGAN) 25 MG tablet Take 1  tablet (25 mg total) by mouth every 8 (eight) hours as needed for refractory nausea / vomiting. 08/28/16   Danelle Berry, PA-C  sucralfate (CARAFATE) 1 g tablet Take 1 tablet (1 g total) by mouth 4 (four) times daily -  with meals and at bedtime. 08/28/16   Danelle Berry, PA-C     Allergies Onion and Zithromax [azithromycin]  No family history on file.  Social History Social History  Substance Use Topics  . Smoking status: Light Tobacco Smoker  . Smokeless tobacco: Never Used  . Alcohol use No    Review of Systems  Constitutional: No fever/chills Eyes: No visual changes.  ENT: No sore throat. Cardiovascular: Denies chest pain. Respiratory: Denies shortness of breath. Gastrointestinal: As above   Genitourinary: Negative for dysuria. Musculoskeletal: Negative for back pain. Skin: Negative for rash. Neurological: Negative for headaches or weakness  10-point ROS otherwise negative.  ____________________________________________   PHYSICAL EXAM:  VITAL SIGNS: ED Triage Vitals  Enc Vitals Group     BP 10/21/16 1918 125/77     Pulse Rate 10/21/16 1918 (!) 108     Resp 10/21/16 1918 18     Temp 10/21/16 1918 98.7 F (37.1 C)     Temp Source 10/21/16 1918 Oral     SpO2 10/21/16 1918 96 %     Weight 10/21/16 1918 182 lb (82.6 kg)     Height 10/21/16 1918 5\' 6"  (1.676 m)  Head Circumference --      Peak Flow --      Pain Score 10/21/16 1934 8     Pain Loc --      Pain Edu? --      Excl. in GC? --     Constitutional: Alert and oriented. No acute distress,Anxious Eyes: Conjunctivae are normal.   Nose: No congestion/rhinnorhea. Mouth/Throat: Mucous membranes are moist.    Cardiovascular: Normal rate, regular rhythm. Grossly normal heart sounds.  Good peripheral circulation. Respiratory: Normal respiratory effort.  No retractions. Lungs CTAB. Gastrointestinal: Mild tenderness to palpation along the upper abdomen. No distention.  No CVA tenderness. Genitourinary:  deferred Musculoskeletal: No lower extremity tenderness nor edema.  Warm and well perfused Neurologic:  Normal speech and language. No gross focal neurologic deficits are appreciated.  Skin:  Skin is warm, dry and intact. No rash noted. Psychiatric: Mood and affect are normal. Speech and behavior are normal.  ____________________________________________   LABS (all labs ordered are listed, but only abnormal results are displayed)  Labs Reviewed  COMPREHENSIVE METABOLIC PANEL - Abnormal; Notable for the following:       Result Value   Glucose, Bld 128 (*)    All other components within normal limits  CBC - Abnormal; Notable for the following:    Hemoglobin 16.2 (*)    HCT 47.7 (*)    All other components within normal limits  URINALYSIS COMPLETEWITH MICROSCOPIC ([facility] ONLY) - Abnormal; Notable for the following:    Color, Urine YELLOW (*)    APPearance HAZY (*)    Leukocytes, UA TRACE (*)    Bacteria, UA RARE (*)    Squamous Epithelial / LPF 0-5 (*)    All other components within normal limits  LIPASE, BLOOD  POC URINE PREG, ED  POCT PREGNANCY, URINE   ____________________________________________  EKG  None ____________________________________________  RADIOLOGY  CT abdomen and pelvis pending ____________________________________________   PROCEDURES  Procedure(s) performed: No    Critical Care performed: No ____________________________________________   INITIAL IMPRESSION / ASSESSMENT AND PLAN / ED COURSE  Pertinent labs & imaging results that were available during my care of the patient were reviewed by me and considered in my medical decision making (see chart for details).  Patient presents with nausea vomiting and diarrhea with abdominal cramping sensation. I suspect gastroenteritis. Lab work is overall reassuring. We'll treat with Zofran, IV fluids and reevaluate.  Clinical Course  ----------------------------------------- 10:23 PM on  10/21/2016 -----------------------------------------  Patient continues to complain of pain, and is requesting something stronger than morphine which she said did nothing for her. Given her continued pain we will obtain CT abdomen and pelvis although her lab work and x-ray are reassuring ____________________________________________   FINAL CLINICAL IMPRESSION(S) / ED DIAGNOSES  Abdominal pain   NEW MEDICATIONS STARTED DURING THIS VISIT:  New Prescriptions   No medications on file     Note:  This document was prepared using Dragon voice recognition software and may include unintentional dictation errors.    Jene Everyobert Tamana Hatfield, MD 10/21/16 2224

## 2016-10-21 NOTE — ED Notes (Signed)
Patient transported to X-ray 

## 2016-10-21 NOTE — ED Notes (Signed)
Patient transported to CT 

## 2016-10-21 NOTE — ED Triage Notes (Signed)
Pt reports abd pain for 2-3 days.  Pt reports n/v/d.  Pt has abd bloating with distention. Pt had last BM today.  Pt also reports dysuria and back pain.  Pt alert.

## 2016-10-21 NOTE — ED Notes (Signed)
MD at bedside. 

## 2016-10-22 MED ORDER — ONDANSETRON 4 MG PO TBDP: 4.0000 mg | ORAL_TABLET | Freq: Three times a day (TID) | ORAL | 0 refills | Status: AC | PRN

## 2016-10-22 NOTE — ED Provider Notes (Signed)
I assumed care of the patient from Dr. Cyril LoosenKinner 11:00 PM. CT scan of the abdomen and pelvis revealed: CLINICAL DATA:  Abdominal pain for 2-3 days. Abdominal bloating with distension.  EXAM: CT ABDOMEN AND PELVIS WITH CONTRAST  TECHNIQUE: Multidetector CT imaging of the abdomen and pelvis was performed using the standard protocol following bolus administration of intravenous contrast.  CONTRAST:  100mL ISOVUE-300 IOPAMIDOL (ISOVUE-300) INJECTION 61%  COMPARISON:  Radiograph 10/21/2016.  CT scan 04/18/2016  FINDINGS: Lower chest: Mild lingular and right middle lobe atelectasis or scarring. Lung bases otherwise clear. Heart size nonenlarged. No pericardial effusion.  Hepatobiliary: Diffuse decreased density of the liver parenchyma, consistent with fatty change with mild fatty sparing near the gallbladder fossa. No focal hepatic abnormalities. No definite calcified gallstones. No biliary dilatation.  Pancreas: Unremarkable. No pancreatic ductal dilatation or surrounding inflammatory changes.  Spleen: Normal in size without focal abnormality.  Adrenals/Urinary Tract: Adrenal glands are stable with mild nodular thickening of the left adrenal gland. There is a stable 8 mm interpolar cyst on the right. The urinary bladder is unremarkable.  Stomach/Bowel: Stomach is within normal limits. Appendix appears normal. No evidence of bowel wall thickening, distention, or inflammatory changes. Large volume of stool in the right and transverse colon.  Vascular/Lymphatic: Aorta is non aneurysmal. No significantly enlarged abdominal or pelvic lymph nodes.  Reproductive: Uterus and bilateral adnexa are unremarkable. Possible nabothian cysts within the cervix.  Other: No free air or free fluid.  Musculoskeletal: Mild degenerative changes at L5-S1. No acute osseous abnormality.  IMPRESSION: 1. No CT evidence for acute intra-abdominal or pelvic pathology. 2. Fatty  infiltration of the liver with mild fatty sparing at the gallbladder fossa. 3. A right renal cyst. 4. No evidence for bowel obstruction. Large volume of stool in the right colon and transverse colon.   Electronically Signed   By: Jasmine PangKim  Fujinaga M.D.   On: 10/22/2016 01:23   Patient will be discharged home with Zofran ODT and advised to take Imodium   Darci Currentandolph N Keyleigh Manninen, MD 10/22/16 [phone]

## 2016-10-22 NOTE — ED Notes (Signed)
Pt upset at discharge, states " this hospital never finds out whats wrong with me. I am in pain". MD Manson PasseyBrown at bedside , showing CT imaging to pt. Pt cursing at RN and MD.

## 2016-10-25 ENCOUNTER — Emergency Department (HOSPITAL_COMMUNITY)
Admission: EM | Admit: 2016-10-25 | Discharge: 2016-10-25 | Disposition: A | Discharge status: Home / Self Care | Payer: Self-pay | Attending: Emergency Medicine | Admitting: Emergency Medicine

## 2016-10-25 ENCOUNTER — Emergency Department (HOSPITAL_COMMUNITY): Payer: Self-pay

## 2016-10-25 ENCOUNTER — Encounter (HOSPITAL_COMMUNITY): Payer: Self-pay | Admitting: *Deleted

## 2016-10-25 DIAGNOSIS — Z79899 Other long term (current) drug therapy: Secondary | ICD-10-CM | POA: Insufficient documentation

## 2016-10-25 DIAGNOSIS — R1012 Left upper quadrant pain: Secondary | ICD-10-CM | POA: Insufficient documentation

## 2016-10-25 DIAGNOSIS — F17200 Nicotine dependence, unspecified, uncomplicated: Secondary | ICD-10-CM | POA: Insufficient documentation

## 2016-10-25 DIAGNOSIS — E119 Type 2 diabetes mellitus without complications: Secondary | ICD-10-CM | POA: Insufficient documentation

## 2016-10-25 LAB — POC URINE PREG, ED: PREG TEST UR: NEGATIVE

## 2016-10-25 LAB — COMPREHENSIVE METABOLIC PANEL
ALK PHOS: 62 U/L (ref 38–126)
ALT: 44 U/L (ref 14–54)
ANION GAP: 9 (ref 5–15)
AST: 28 U/L (ref 15–41)
Albumin: 4.1 g/dL (ref 3.5–5.0)
BILIRUBIN TOTAL: 0.6 mg/dL (ref 0.3–1.2)
BUN: 9 mg/dL (ref 6–20)
CALCIUM: 9.4 mg/dL (ref 8.9–10.3)
CO2: 23 mmol/L (ref 22–32)
CREATININE: 0.83 mg/dL (ref 0.44–1.00)
Chloride: 109 mmol/L (ref 101–111)
Glucose, Bld: 105 mg/dL — ABNORMAL HIGH (ref 65–99)
Potassium: 3.7 mmol/L (ref 3.5–5.1)
Sodium: 141 mmol/L (ref 135–145)
TOTAL PROTEIN: 6.9 g/dL (ref 6.5–8.1)

## 2016-10-25 LAB — URINALYSIS, ROUTINE W REFLEX MICROSCOPIC
BILIRUBIN URINE: NEGATIVE
Glucose, UA: NEGATIVE mg/dL
Hgb urine dipstick: NEGATIVE
Ketones, ur: NEGATIVE mg/dL
Leukocytes, UA: NEGATIVE
NITRITE: NEGATIVE
PROTEIN: NEGATIVE mg/dL
SPECIFIC GRAVITY, URINE: 1.026 (ref 1.005–1.030)
pH: 5.5 (ref 5.0–8.0)

## 2016-10-25 LAB — CBC
HCT: 45.9 % (ref 36.0–46.0)
HEMOGLOBIN: 15.5 g/dL — AB (ref 12.0–15.0)
MCH: 32 pg (ref 26.0–34.0)
MCHC: 33.8 g/dL (ref 30.0–36.0)
MCV: 94.6 fL (ref 78.0–100.0)
PLATELETS: 181 10*3/uL (ref 150–400)
RBC: 4.85 MIL/uL (ref 3.87–5.11)
RDW: 13.3 % (ref 11.5–15.5)
WBC: 10.7 10*3/uL — ABNORMAL HIGH (ref 4.0–10.5)

## 2016-10-25 LAB — I-STAT CG4 LACTIC ACID, ED: LACTIC ACID, VENOUS: 1 mmol/L (ref 0.5–1.9)

## 2016-10-25 LAB — LIPASE, BLOOD: Lipase: 49 U/L (ref 11–51)

## 2016-10-25 MED ORDER — METOCLOPRAMIDE HCL 10 MG PO TABS
10.0000 mg | ORAL_TABLET | Freq: Once | ORAL | Status: AC
  Administered 2016-10-25: 10 mg via ORAL
  Filled 2016-10-25: qty 1

## 2016-10-25 MED ORDER — DICYCLOMINE HCL 10 MG PO CAPS
20.0000 mg | ORAL_CAPSULE | Freq: Once | ORAL | Status: AC
  Administered 2016-10-25: 20 mg via ORAL
  Filled 2016-10-25: qty 2

## 2016-10-25 MED ORDER — SODIUM CHLORIDE 0.9 % IV BOLUS (SEPSIS)
1000.0000 mL | Freq: Once | INTRAVENOUS | Status: AC
Start: 2016-10-25 — End: 2016-10-25
  Administered 2016-10-25: 1000 mL via INTRAVENOUS

## 2016-10-25 MED ORDER — SUCRALFATE 1 GM/10ML PO SUSP
1.0000 g | Freq: Once | ORAL | Status: AC
Start: 2016-10-25 — End: 2016-10-25
  Administered 2016-10-25: 1 g via ORAL
  Filled 2016-10-25: qty 10

## 2016-10-25 MED ORDER — RANITIDINE HCL 150 MG/10ML PO SYRP
300.0000 mg | ORAL_SOLUTION | Freq: Once | ORAL | Status: AC
  Administered 2016-10-25: 300 mg via ORAL
  Filled 2016-10-25: qty 20

## 2016-10-25 MED ORDER — IOPAMIDOL (ISOVUE-370) INJECTION 76%
INTRAVENOUS | Status: AC
  Administered 2016-10-25: 100 mL
  Filled 2016-10-25: qty 100

## 2016-10-25 NOTE — ED Provider Notes (Signed)
[facility]EMERGENCY DEPT Provider Note   CSN: [id number] Arrival date & time: 10/25/16  4098  By signing my name below, I, Nelwyn Salisbury, attest that this documentation has been prepared under the direction and in the presence of Tomasita Crumble, MD . Electronically Signed: Nelwyn Salisbury, Scribe. 10/25/2016. 1:39 AM.  History   Chief Complaint Chief Complaint  Patient presents with  . Abdominal Pain   The history is provided by the patient. No language interpreter was used.    HPI Comments:  Paula Cherry is a 40 y.o. female with pmhx of GERD and DM who presents to the Emergency Department complaining of exacerbated constant unchanged abdominal pain beginning 12 years ago but exacerbated 3 days ago. She describes her symptoms as a "twisting" epigastric pain, worse on her left side, exacerbated by eating. Pt notes that her pain radiates into her back. No alleviating factors indicated. She has had abdominal pain in the past, but her current pain is much worse than anything she has experienced before. Pt has been seen by a gastroenterologist but has never found a successful diagnosis for her pain. She notes that she has had elevated lipase levels in the past and has had gallstones. Pt reports associated vomiting, diarrhea, and light-headedness secondary to pain. She denies any recent trauma or wound.   Past Medical History:  Diagnosis Date  . Diabetes mellitus without complication (HCC)   . Fatty liver   . Polycystic ovarian disease   . Traumatic brain injury (HCC)     There are no active problems to display for this patient.   Past Surgical History:  Procedure Laterality Date  . BREAST SURGERY    . CYST REMOVAL LEG      OB History    No data available       Home Medications    Prior to Admission medications   Medication Sig Start Date End Date Taking? Authorizing Provider  amitriptyline (ELAVIL) 25 MG tablet Take 50 mg by mouth at bedtime as needed for sleep.  10/24/15  Yes  Historical Provider, MD  ibuprofen (ADVIL,MOTRIN) 200 MG tablet Take 400 mg by mouth every 6 (six) hours as needed for mild pain or moderate pain.   Yes Historical Provider, MD  naproxen sodium (ALEVE) 220 MG tablet Take 220-440 mg by mouth 2 (two) times daily as needed (for headaches).   Yes Historical Provider, MD  ondansetron (ZOFRAN ODT) 4 MG disintegrating tablet Take 1 tablet (4 mg total) by mouth every 8 (eight) hours as needed for nausea or vomiting. 10/22/16  Yes Darci Current, MD  oxyCODONE-acetaminophen (PERCOCET) 5-325 MG tablet Take 1-2 tablets by mouth every 8 (eight) hours as needed for severe pain. 08/28/16  Yes Danelle Berry, PA-C  promethazine (PHENERGAN) 25 MG tablet Take 1 tablet (25 mg total) by mouth every 8 (eight) hours as needed for refractory nausea / vomiting. 08/28/16  Yes Danelle Berry, PA-C    Family History No family history on file.  Social History Social History  Substance Use Topics  . Smoking status: Light Tobacco Smoker  . Smokeless tobacco: Never Used  . Alcohol use No     Allergies   Onion and Zithromax [azithromycin]   Review of Systems Review of Systems 10 Systems reviewed and are negative for acute change except as noted in the HPI.  Physical Exam Updated Vital Signs BP 104/62 (BP Location: Right Arm)   Pulse 76   Temp 98.2 F (36.8 C) (Oral)   Resp 18  LMP 12/21/2015 (Approximate) Comment: neg preg test; PCOS  SpO2 96%   Physical Exam  Constitutional: She is oriented to person, place, and time. She appears well-developed and well-nourished. No distress.  HENT:  Head: Normocephalic and atraumatic.  Nose: Nose normal.  Mouth/Throat: Oropharynx is clear and moist. No oropharyngeal exudate.  Eyes: Conjunctivae and EOM are normal. Pupils are equal, round, and reactive to light. No scleral icterus.  Neck: Normal range of motion. Neck supple. No JVD present. No tracheal deviation present. No thyromegaly present.  Cardiovascular: Normal  rate, regular rhythm and normal heart sounds.  Exam reveals no gallop and no friction rub.   No murmur heard. Pulmonary/Chest: Effort normal and breath sounds normal. No respiratory distress. She has no wheezes. She exhibits no tenderness.  Abdominal: Soft. Bowel sounds are normal. She exhibits no distension and no mass. There is no tenderness. There is no rebound and no guarding.  Musculoskeletal: Normal range of motion. She exhibits no edema or tenderness.  Lymphadenopathy:    She has no cervical adenopathy.  Neurological: She is alert and oriented to person, place, and time. No cranial nerve deficit. She exhibits normal muscle tone.  Skin: Skin is warm and dry. No rash noted. No erythema. No pallor.  Nursing note and vitals reviewed.    ED Treatments / Results  DIAGNOSTIC STUDIES:  Oxygen Saturation is 97% on RA, normal by my interpretation.    COORDINATION OF CARE:  2:06 AM Discussed treatment plan with pt at bedside which includes pain medication and referral to gastroenterologist and pt agreed to plan.  Labs (all labs ordered are listed, but only abnormal results are displayed) Labs Reviewed  COMPREHENSIVE METABOLIC PANEL - Abnormal; Notable for the following:       Result Value   Glucose, Bld 105 (*)    All other components within normal limits  CBC - Abnormal; Notable for the following:    WBC 10.7 (*)    Hemoglobin 15.5 (*)    All other components within normal limits  LIPASE, BLOOD  URINALYSIS, ROUTINE W REFLEX MICROSCOPIC (NOT AT [facility])  POC URINE PREG, ED  I-STAT CG4 LACTIC ACID, ED    EKG  EKG Interpretation None       Radiology Ct Angio Abd/pel W And/or Wo Contrast  Result Date: 10/25/2016 CLINICAL DATA:  40 year old female with left lower quadrant abdominal pain. Concern for mesenteric ischemia. EXAM: CTA ABDOMEN AND PELVIS wITHOUT AND WITH CONTRAST TECHNIQUE: Multidetector CT imaging of the abdomen and pelvis was performed using the standard protocol  during bolus administration of intravenous contrast. Multiplanar reconstructed images and MIPs were obtained and reviewed to evaluate the vascular anatomy. CONTRAST:  100 cc Isovue 370 COMPARISON:  CT dated 10/22/2016 FINDINGS: VASCULAR Aorta: Normal caliber aorta without aneurysm, dissection, vasculitis or significant stenosis. Celiac: Patent without evidence of aneurysm, dissection, vasculitis or significant stenosis. SMA: Patent without evidence of aneurysm, dissection, vasculitis or significant stenosis. Renals: Both renal arteries are patent without evidence of aneurysm, dissection, vasculitis, fibromuscular dysplasia or significant stenosis. IMA: Patent without evidence of aneurysm, dissection, vasculitis or significant stenosis. Inflow: Patent without evidence of aneurysm, dissection, vasculitis or significant stenosis. Proximal Outflow: Bilateral common femoral and visualized portions of the superficial and profunda femoral arteries are patent without evidence of aneurysm, dissection, vasculitis or significant stenosis. Veins: The splenic vein, SMV, and the main portal vein appear patent. No portal venous gas identified. Review of the MIP images confirms the above findings. NON-VASCULAR Lower chest: The visualized lung bases  are clear. No intra-abdominal free air or free fluid. Hepatobiliary: Diffuse fatty infiltration of the liver. The gallbladder is unremarkable. No intrahepatic biliary ductal dilatation. Pancreas: Unremarkable. No pancreatic ductal dilatation or surrounding inflammatory changes. Spleen: Normal in size without focal abnormality. Adrenals/Urinary Tract: There is an 8 mm indeterminate left adrenal nodule most likely an adenoma. This is similar to the CT dated 04/18/2016. The right adrenal gland appear unremarkable. A 1 cm right renal cyst. The kidneys, visualized ureters, and urinary bladder are otherwise unremarkable. There is no hydronephrosis on either side. Stomach/Bowel: There is no  evidence of bowel obstruction or active inflammation. Normal appendix. Lymphatic: No adenopathy. Reproductive: The uterus and ovaries are grossly unremarkable. Other: Small fat containing umbilical hernia. Musculoskeletal: No acute or significant osseous findings. IMPRESSION: No acute intra-abdominal pelvic pathology. No evidence of mesenteric ischemia as clinically questioned. **An incidental finding of potential clinical significance has been found. An 8 mm indeterminate left adrenal nodule, likely an adenoma. Nonemergent MRI may provide better characterisation.** Electronically Signed   By: Elgie Collard M.D.   On: 10/25/2016 03:44    Procedures Procedures (including critical care time)  Medications Ordered in ED Medications  ranitidine (ZANTAC) 150 MG/10ML syrup 300 mg (300 mg Oral Given 10/25/16 0225)  sucralfate (CARAFATE) 1 GM/10ML suspension 1 g (1 g Oral Given 10/25/16 0225)  metoCLOPramide (REGLAN) tablet 10 mg (10 mg Oral Given 10/25/16 0225)  dicyclomine (BENTYL) capsule 20 mg (20 mg Oral Given 10/25/16 0225)  sodium chloride 0.9 % bolus 1,000 mL (1,000 mLs Intravenous New Bag/Given 10/25/16 0229)  iopamidol (ISOVUE-370) 76 % injection (100 mLs  Contrast Given 10/25/16 0248)     Initial Impression / Assessment and Plan / ED Course  I have reviewed the triage vital signs and the nursing notes.  Pertinent labs & imaging results that were available during my care of the patient were reviewed by me and considered in my medical decision making (see chart for details).  Clinical Course    Patient presents to the ED for abdominal pain.  This is a acute on chronic abdominal pain.  Labs are at her normal baseline.  She has a food aversion so I obtained CTA for mesenteric ischemia but that was negative as well.  Patient is not requesting narcotics and she does not exhibit any drug seeking behavior.  She wants to know a diagnosis.  I gave her follow up to see GI for possible scope.  She  was given reglan, ranitidine, bentyl, and sucralfate with very minimal relief.  Patietn feels ok going home at this time.  I do not believe there is any acute emergent condition here.  VS remain within her normal limits and she is safe for DC.  Final Clinical Impressions(s) / ED Diagnoses   Final diagnoses:  None    New Prescriptions New Prescriptions   No medications on file     I personally performed the services described in this documentation, which was scribed in my presence. The recorded information has been reviewed and is accurate.       Tomasita Crumble, MD 10/25/16 [id number]

## 2016-10-25 NOTE — ED Triage Notes (Signed)
Pt c/o LLQ x 2 days, worsening this morning and woke her from sleep. Hx of pancreatitis, pain worsens after eating

## 2016-11-12 ENCOUNTER — Emergency Department (HOSPITAL_COMMUNITY)
Admission: EM | Admit: 2016-11-12 | Discharge: 2016-11-13 | Disposition: A | Discharge status: Home / Self Care | Payer: Self-pay | Attending: Emergency Medicine | Admitting: Emergency Medicine

## 2016-11-12 ENCOUNTER — Emergency Department (HOSPITAL_COMMUNITY): Payer: Self-pay

## 2016-11-12 ENCOUNTER — Encounter (HOSPITAL_COMMUNITY): Payer: Self-pay

## 2016-11-12 DIAGNOSIS — Z79899 Other long term (current) drug therapy: Secondary | ICD-10-CM | POA: Insufficient documentation

## 2016-11-12 DIAGNOSIS — F0781 Postconcussional syndrome: Secondary | ICD-10-CM | POA: Insufficient documentation

## 2016-11-12 DIAGNOSIS — R51 Headache: Secondary | ICD-10-CM | POA: Insufficient documentation

## 2016-11-12 DIAGNOSIS — F17210 Nicotine dependence, cigarettes, uncomplicated: Secondary | ICD-10-CM | POA: Insufficient documentation

## 2016-11-12 DIAGNOSIS — E119 Type 2 diabetes mellitus without complications: Secondary | ICD-10-CM | POA: Insufficient documentation

## 2016-11-12 MED ORDER — KETOROLAC TROMETHAMINE 30 MG/ML IJ SOLN
30.0000 mg | Freq: Once | INTRAMUSCULAR | Status: AC
  Administered 2016-11-12: 30 mg via INTRAVENOUS
  Filled 2016-11-12: qty 1

## 2016-11-12 MED ORDER — SODIUM CHLORIDE 0.9 % IV BOLUS (SEPSIS)
1000.0000 mL | Freq: Once | INTRAVENOUS | Status: AC
  Administered 2016-11-12: 1000 mL via INTRAVENOUS

## 2016-11-12 MED ORDER — PROCHLORPERAZINE EDISYLATE 5 MG/ML IJ SOLN
10.0000 mg | Freq: Once | INTRAMUSCULAR | Status: AC
  Administered 2016-11-12: 10 mg via INTRAVENOUS
  Filled 2016-11-12: qty 2

## 2016-11-12 MED ORDER — DIPHENHYDRAMINE HCL 50 MG/ML IJ SOLN
25.0000 mg | Freq: Once | INTRAMUSCULAR | Status: AC
  Administered 2016-11-12: 25 mg via INTRAVENOUS
  Filled 2016-11-12: qty 1
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
## 2016-11-12 NOTE — ED Notes (Signed)
Pt requesting pain medicine, informed that EDP would be in shortly

## 2016-11-13 MED ORDER — BUTALBITAL-APAP-CAFFEINE 50-325-40 MG PO TABS: 1.0000 | ORAL_TABLET | Freq: Four times a day (QID) | ORAL | 0 refills | Status: AC | PRN

## 2016-11-13 MED ORDER — LORAZEPAM 2 MG/ML IJ SOLN
0.5000 mg | Freq: Once | INTRAMUSCULAR | Status: AC
  Administered 2016-11-13: 0.5 mg via INTRAVENOUS
  Filled 2016-11-13: qty 1

## 2016-11-13 MED ORDER — IBUPROFEN 800 MG PO TABS: 800.0000 mg | ORAL_TABLET | Freq: Three times a day (TID) | ORAL | 0 refills | Status: AC | PRN
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
## 2016-11-13 NOTE — Discharge Instructions (Signed)
Return here as needed. Follow up with a primary doctor. Your CT scan was normal

## 2016-11-13 NOTE — ED Notes (Signed)
Pt states pain is better but feels very anxious. EDP aware

## 2017-01-03 ENCOUNTER — Emergency Department (HOSPITAL_COMMUNITY): Payer: Self-pay

## 2017-01-03 ENCOUNTER — Emergency Department (HOSPITAL_COMMUNITY)
Admission: EM | Admit: 2017-01-03 | Discharge: 2017-01-03 | Disposition: A | Discharge status: Home / Self Care | Payer: Self-pay | Attending: Emergency Medicine | Admitting: Emergency Medicine

## 2017-01-03 ENCOUNTER — Encounter (HOSPITAL_COMMUNITY): Payer: Self-pay

## 2017-01-03 DIAGNOSIS — M4802 Spinal stenosis, cervical region: Secondary | ICD-10-CM | POA: Insufficient documentation

## 2017-01-03 DIAGNOSIS — F17210 Nicotine dependence, cigarettes, uncomplicated: Secondary | ICD-10-CM | POA: Insufficient documentation

## 2017-01-03 DIAGNOSIS — E119 Type 2 diabetes mellitus without complications: Secondary | ICD-10-CM | POA: Insufficient documentation

## 2017-01-03 DIAGNOSIS — Z79899 Other long term (current) drug therapy: Secondary | ICD-10-CM | POA: Insufficient documentation

## 2017-01-03 DIAGNOSIS — M542 Cervicalgia: Secondary | ICD-10-CM

## 2017-01-03 DIAGNOSIS — M47812 Spondylosis without myelopathy or radiculopathy, cervical region: Secondary | ICD-10-CM | POA: Insufficient documentation

## 2017-01-03 MED ORDER — ORPHENADRINE CITRATE ER 100 MG PO TB12: 100.0000 mg | ORAL_TABLET | Freq: Two times a day (BID) | ORAL | 0 refills | Status: AC

## 2017-01-03 NOTE — Discharge Instructions (Signed)
Medications: Norflex  Treatment: Take Norflex twice daily as needed for muscle pain and spasms. You can alternate ibuprofen and Tylenol every 4 hours. Use ice alternating 20 minutes on, 20 minutes off. Continue your neck stretches as tolerated.  Follow-up: Please follow-up with Dr. Bevely Palmeritty, a neurosurgeon, for further evaluation and treatment of your symptoms. Please follow-up with your primary care provider if you are unable to access treatment. Please return to emergency department if you develop any new or worsening symptoms including inability to move your arms or legs, complete numbness in your extremities, or any other new or concerning symptoms.
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
## 2017-01-03 NOTE — ED Notes (Signed)
ED Provider at bedside. 

## 2017-01-03 NOTE — ED Triage Notes (Signed)
Pt states that for the past several weeks she has been having central neck pain that radiates to both of her shoulders, pain gives her a migraines, denies injuries, unable to relieve pain with heat and ice and OTC medications.

## 2017-01-03 NOTE — ED Notes (Signed)
Patient transported to CT 

## 2017-07-31 ENCOUNTER — Emergency Department
Admission: EM | Admit: 2017-07-31 | Discharge: 2017-07-31 | Disposition: A | Discharge status: Home / Self Care | Payer: Self-pay | Attending: Emergency Medicine | Admitting: Emergency Medicine

## 2017-07-31 ENCOUNTER — Encounter: Payer: Self-pay | Admitting: Emergency Medicine

## 2017-07-31 ENCOUNTER — Emergency Department: Payer: Self-pay

## 2017-07-31 DIAGNOSIS — R079 Chest pain, unspecified: Secondary | ICD-10-CM | POA: Insufficient documentation

## 2017-07-31 DIAGNOSIS — Z5321 Procedure and treatment not carried out due to patient leaving prior to being seen by health care provider: Secondary | ICD-10-CM | POA: Insufficient documentation

## 2017-07-31 LAB — BASIC METABOLIC PANEL
Anion gap: 6 (ref 5–15)
BUN: 9 mg/dL (ref 6–20)
CO2: 24 mmol/L (ref 22–32)
CREATININE: 0.7 mg/dL (ref 0.44–1.00)
Calcium: 8.9 mg/dL (ref 8.9–10.3)
Chloride: 109 mmol/L (ref 101–111)
GFR calc Af Amer: >60 mL/min (ref >60)
GLUCOSE: 111 mg/dL — AB (ref 65–99)
POTASSIUM: 4 mmol/L (ref 3.5–5.1)
Sodium: 139 mmol/L (ref 135–145)

## 2017-07-31 LAB — CBC
HEMATOCRIT: 43 % (ref 35.0–47.0)
Hemoglobin: 15.1 g/dL (ref 12.0–16.0)
MCH: 32.8 pg (ref 26.0–34.0)
MCHC: 35.1 g/dL (ref 32.0–36.0)
MCV: 93.3 fL (ref 80.0–100.0)
Platelets: 178 10*3/uL (ref 150–440)
RBC: 4.61 MIL/uL (ref 3.80–5.20)
RDW: 13.3 % (ref 11.5–14.5)
WBC: 9.6 10*3/uL (ref 3.6–11.0)

## 2017-07-31 LAB — TROPONIN I: Troponin I: <0.03 ng/mL (ref <0.03)

## 2017-07-31 IMAGING — CR DG CHEST 2V
2 series · 2 of 2 positions shown · non-contrast
Comparison: None.

CLINICAL DATA: Chest pain since this morning

EXAM:
CHEST  2 VIEW

[chest pa]
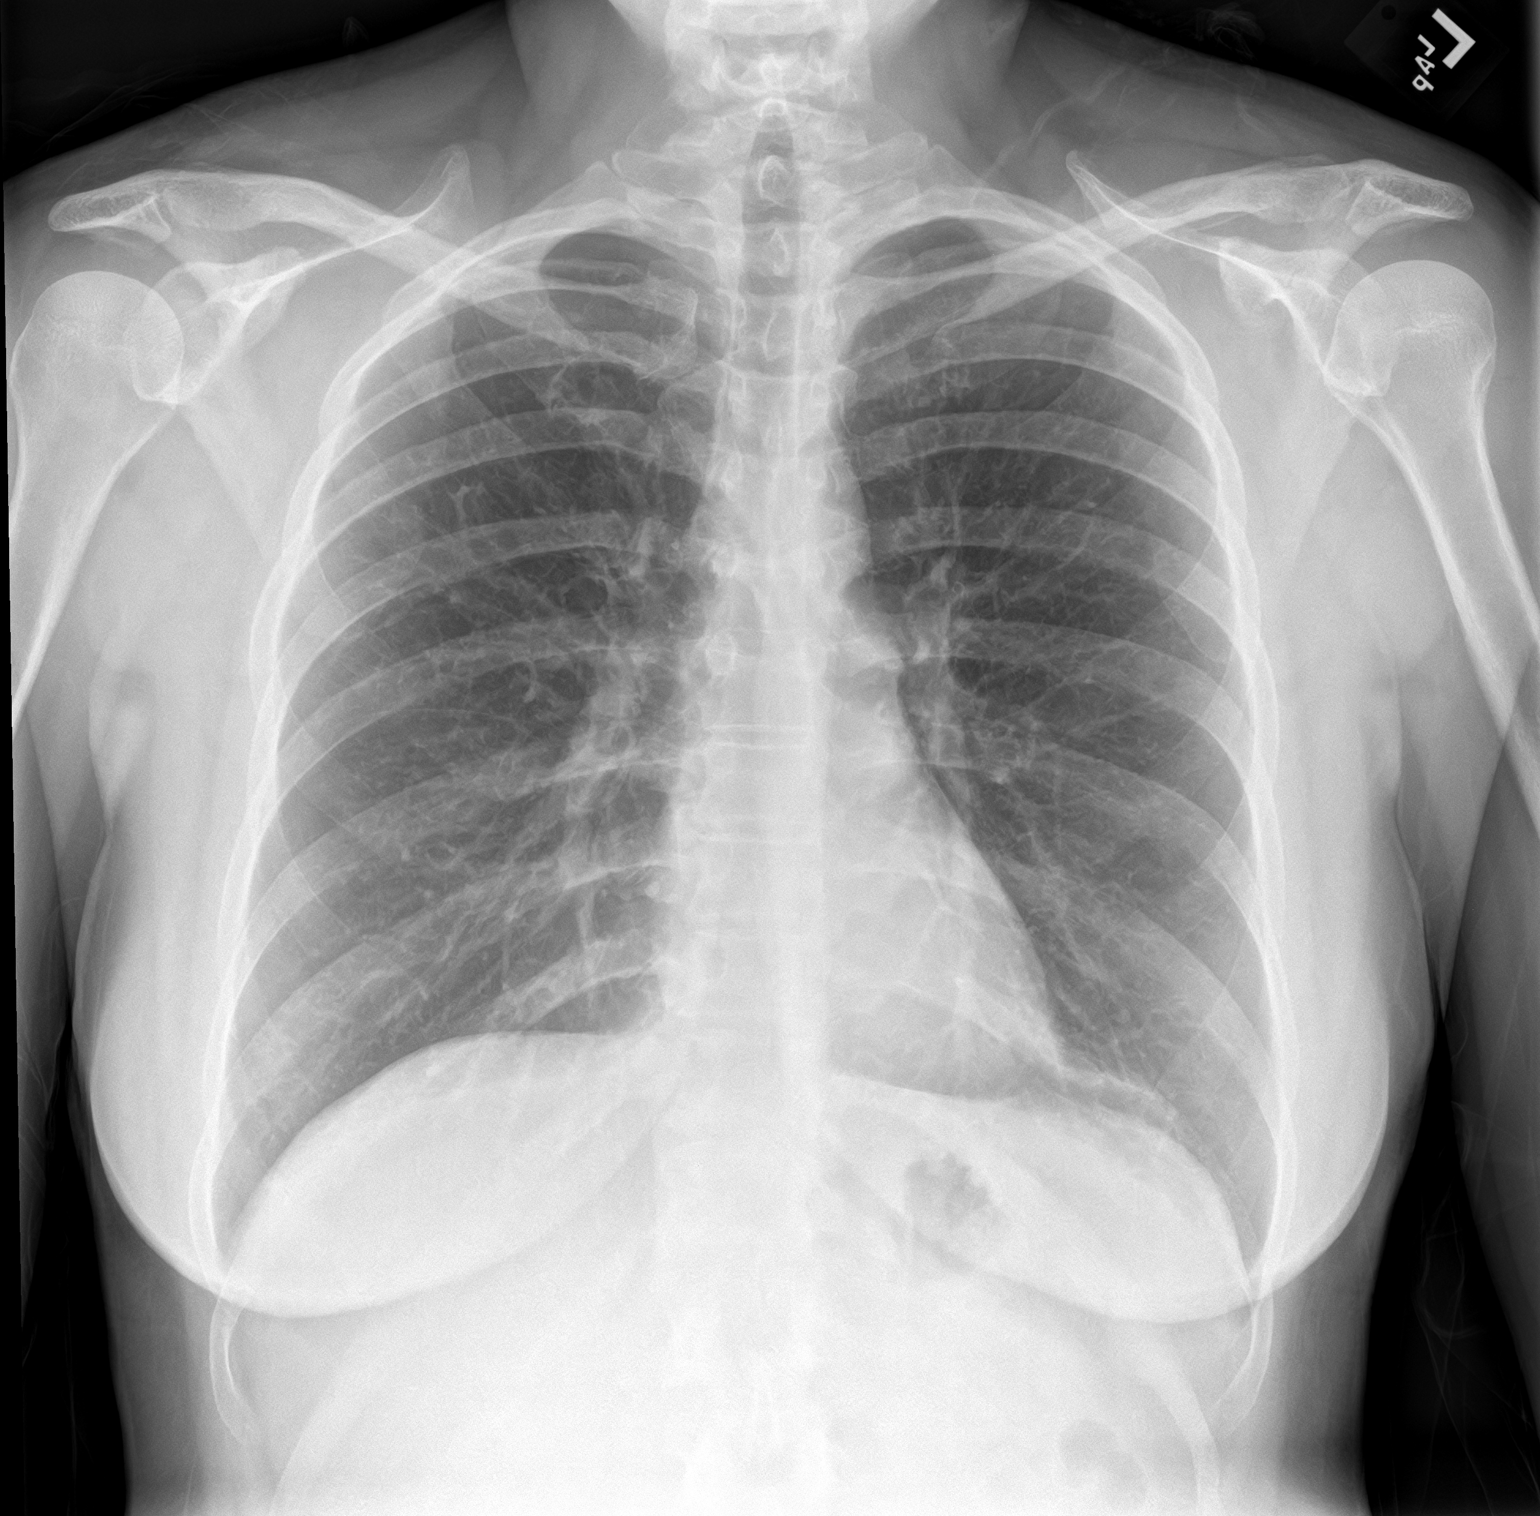

[chest lat]
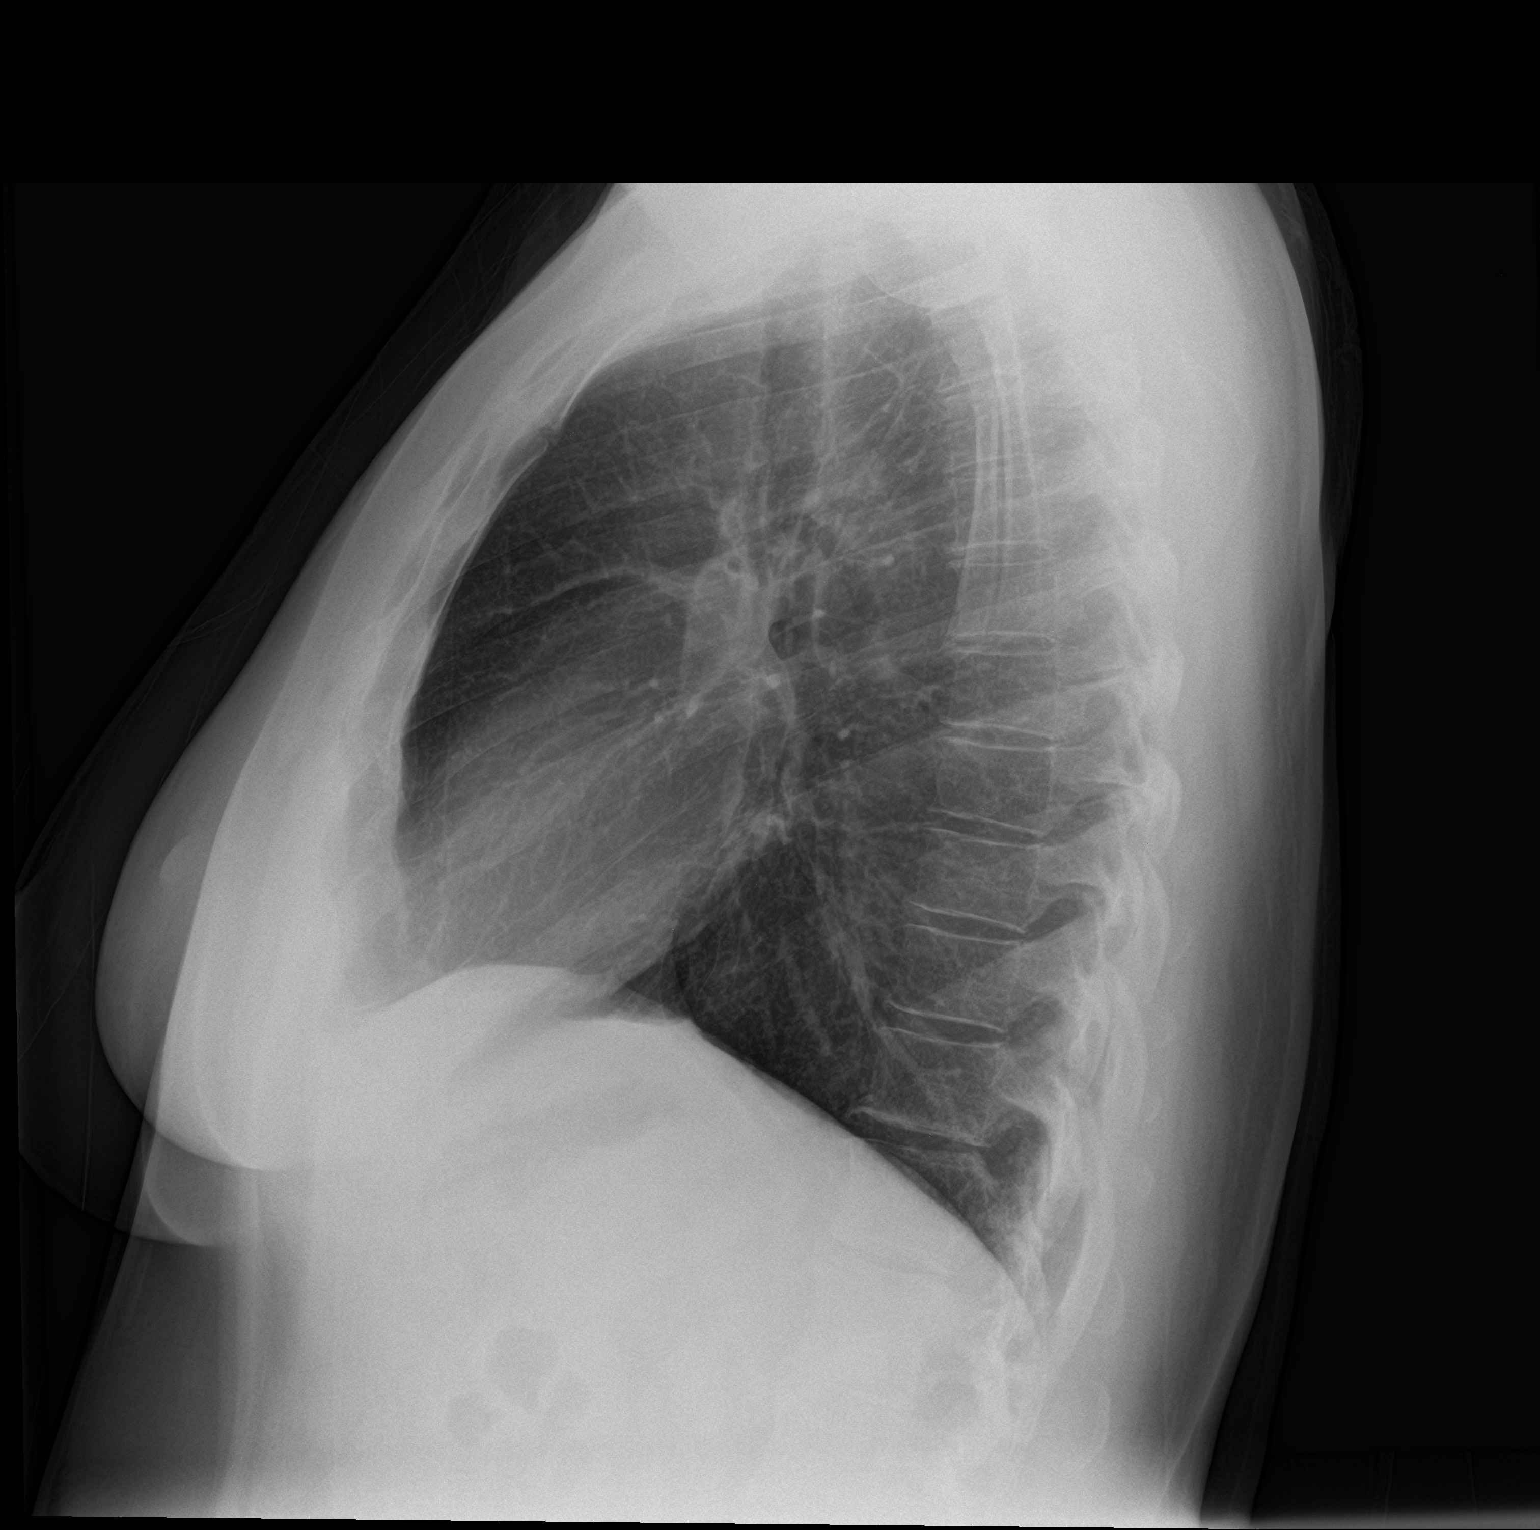

[2 of 2 positions shown; findings below may reference images not displayed]

FINDINGS: Normal heart size and mediastinal contours. No acute infiltrate or
edema. No effusion or pneumothorax. No acute osseous findings.
IMPRESSION: Negative chest.
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
## 2017-07-31 NOTE — ED Notes (Signed)
AAOx3.  Skin warm and dry.  To Xray via wheelchair.  NAD
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
## 2017-09-19 ENCOUNTER — Ambulatory Visit: Payer: Self-pay | Admitting: Cardiology

## 2018-03-11 ENCOUNTER — Ambulatory Visit: Payer: Self-pay

## 2018-04-01 ENCOUNTER — Ambulatory Visit: Payer: Self-pay | Attending: Oncology | Admitting: *Deleted

## 2018-04-01 VITALS — BP 126/71 | HR 96 | Temp 97.1°F | Ht 67.0 in | Wt 166.0 lb

## 2018-04-01 DIAGNOSIS — N889 Noninflammatory disorder of cervix uteri, unspecified: Secondary | ICD-10-CM

## 2018-04-01 NOTE — Patient Instructions (Signed)
Gave patient hand-out, Women Staying Healthy, Active and Well from [facility], with education on breast health, pap smears, heart and colon health. 

## 2018-04-01 NOTE — Progress Notes (Signed)
Subjective:     Patient ID: Paula Cherry, female   DOB: 06/15/1976, 42 y.o.   MRN: [id number]  HPI   Review of Systems     Objective:   Physical Exam  Genitourinary: There is no rash, tenderness, lesion or injury on the right labia. There is no rash, tenderness, lesion or injury on the left labia. No erythema, tenderness or bleeding in the vagina. No foreign body in the vagina. No signs of injury around the vagina. No vaginal discharge found.         Assessment:     42 year old White female referred to [facility] by Dr. Ethelda Chickaroline Roberts at [facility] for abnormal cervical exam. Last pap on 02/17/18 was negative / negative.  Patient states she has had this "bump" on her cervix for about a year, and feels it's getting bigger.  States she can feel it on manual exam.  On pelvic exam I can see an approximate 0.5 cm lobulated cauliflower like nodule at 6:00 on the cervix.  There is also an area of excoriation at the nodule.  At about 3:00 there several Nabothian cysts noted.  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  Family history of cervical cancer in her sister.  She also has multiple family members with other cancers.      Plan:     Will refer for further evaluation of the abnormal cervix.  Joellyn Quailshristy Burton to schedule patient to see a gynecologist.  Will follow-up per [facility] protocol.

## 2018-04-22 ENCOUNTER — Encounter: Payer: Self-pay | Admitting: Obstetrics and Gynecology

## 2018-04-29 ENCOUNTER — Other Ambulatory Visit: Payer: Self-pay | Admitting: Obstetrics and Gynecology

## 2018-04-29 ENCOUNTER — Encounter: Payer: Self-pay | Admitting: Obstetrics and Gynecology

## 2018-04-29 ENCOUNTER — Ambulatory Visit (INDEPENDENT_AMBULATORY_CARE_PROVIDER_SITE_OTHER): Payer: Self-pay | Admitting: Obstetrics and Gynecology

## 2018-04-29 VITALS — BP 94/67 | HR 84 | Ht 65.5 in | Wt 169.1 lb

## 2018-04-29 DIAGNOSIS — N889 Noninflammatory disorder of cervix uteri, unspecified: Secondary | ICD-10-CM

## 2018-04-29 DIAGNOSIS — N888 Other specified noninflammatory disorders of cervix uteri: Secondary | ICD-10-CM

## 2018-04-29 NOTE — Progress Notes (Signed)
    GYNECOLOGY CLINIC COLPOSCOPY PROCEDURE NOTE  42 y.o. G3P0030 here for referred from the [facility] program due to cervical lesion.  Last pap smear was on 02/06/2018 and was normal with no HPV testing.  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 She also has a family history of cervical cancer in her sister. In addition, she  has multiple family members with other cancers.    Discussed role for HPV in cervical dysplasia, need for surveillance.  Patient given informed consent, signed copy in the chart, time out was performed.  Placed in lithotomy position. Cervix viewed with speculum and colposcope after application of acetic acid.   Colposcopy adequate? Yes Findings: large nabothian cyst at 6 o'clock, ~ 0.5 cm, with slight excoriation of the area .  Several other small nabothian cysts along the 3-4 o'clock region. Cervical os with slight ectropion along posterior surface. Biopsy obtained at 6 o'clock region (with moderate amount of mucus expressed).  ECC not obtained. All specimens were labeled and sent to pathology.   Patient was given post procedure instructions.  Will follow up pathology and manage accordingly; patient will be contacted with results and recommendations.  Routine preventative health maintenance measures emphasized.    Hildred Laser, MD [facility]

## 2018-04-29 NOTE — Progress Notes (Signed)
Pt has been having abnormal period cycles. Lasting longer than usually and having clots.

## 2018-05-01 LAB — PATHOLOGY

## 2018-05-04 ENCOUNTER — Encounter: Payer: Self-pay | Admitting: *Deleted

## 2018-05-04 NOTE — Progress Notes (Signed)
Patient with benign cervical biopsy of a nabothian cysts.  Results forwarded to Dr. Ethelda Chick at [facility].  Next pap due in 5 years.  [facility] to [place].
